# Patient Record
Sex: Male | Born: 1945 | Race: White | Hispanic: No | Marital: Married | Smoking: Former smoker
Health system: Southern US, Community
[De-identification: ages and names within clinical notes are randomized; demographics above are authoritative.]

## PROBLEM LIST (undated history)

## (undated) DIAGNOSIS — Z8719 Personal history of other diseases of the digestive system: Secondary | ICD-10-CM

## (undated) DIAGNOSIS — M1990 Unspecified osteoarthritis, unspecified site: Secondary | ICD-10-CM

## (undated) DIAGNOSIS — I10 Essential (primary) hypertension: Secondary | ICD-10-CM

## (undated) DIAGNOSIS — J449 Chronic obstructive pulmonary disease, unspecified: Secondary | ICD-10-CM

## (undated) DIAGNOSIS — E039 Hypothyroidism, unspecified: Secondary | ICD-10-CM

## (undated) DIAGNOSIS — J302 Other seasonal allergic rhinitis: Secondary | ICD-10-CM

## (undated) DIAGNOSIS — K219 Gastro-esophageal reflux disease without esophagitis: Secondary | ICD-10-CM

## (undated) DIAGNOSIS — F419 Anxiety disorder, unspecified: Secondary | ICD-10-CM

## (undated) DIAGNOSIS — E7800 Pure hypercholesterolemia, unspecified: Secondary | ICD-10-CM

## (undated) DIAGNOSIS — G8929 Other chronic pain: Secondary | ICD-10-CM

## (undated) DIAGNOSIS — R1013 Epigastric pain: Secondary | ICD-10-CM

## (undated) HISTORY — PX: TONSILLECTOMY: SUR1361

## (undated) HISTORY — DX: Other chronic pain: G89.29

## (undated) HISTORY — DX: Epigastric pain: R10.13

## (undated) HISTORY — PX: EYE SURGERY: SHX253

## (undated) HISTORY — PX: FRACTURE SURGERY: SHX138

## (undated) HISTORY — PX: HERNIA REPAIR: SHX51

## (undated) HISTORY — PX: KNEE SURGERY: SHX244

## (undated) HISTORY — PX: SHOULDER ARTHROSCOPY: SHX128

---

## 2013-10-21 ENCOUNTER — Other Ambulatory Visit: Payer: Self-pay | Admitting: Orthopaedic Surgery

## 2013-10-21 DIAGNOSIS — G8929 Other chronic pain: Secondary | ICD-10-CM

## 2013-10-31 ENCOUNTER — Ambulatory Visit
Admission: RE | Admit: 2013-10-31 | Discharge: 2013-10-31 | Disposition: A | Discharge status: Home / Self Care | Payer: Medicare Other | Source: Ambulatory Visit | Attending: Orthopaedic Surgery | Admitting: Orthopaedic Surgery

## 2013-10-31 DIAGNOSIS — G8929 Other chronic pain: Secondary | ICD-10-CM

## 2013-12-30 ENCOUNTER — Encounter (HOSPITAL_COMMUNITY): Payer: Self-pay | Admitting: Pharmacy Technician

## 2013-12-30 ENCOUNTER — Other Ambulatory Visit: Payer: Self-pay | Admitting: Neurosurgery

## 2013-12-31 NOTE — Pre-Procedure Instructions (Addendum)
Francisco Butler  12/31/2013   Your procedure is scheduled IW:[id number]  01/05/14 at 0730AM  Report to [facility] Short Stay Main Entrance "A" at 530 AM.  Call this number if you have problems the morning of surgery: [phone]   Remember:   Do not eat food or drink liquids after midnight Monday   Take these medicines the morning of surgery with A SIP OF WATER: all inhalers,Synthroid,Singular, Prilosec and Tramadol if needed for pain  Stop all NSAIDS (Naproxen,Aleve,Advil, Aspirin, Vitamins and Herbal medication as of today.  Do not wear jewelry, make-up or nail polish.  Do not wear lotions, powders, or perfumes. You may wear deodorant.  Do not shave 48 hours prior to surgery. Men may shave face and neck.  Do not bring valuables to the hospital.  [facility] is not responsible  for any belongings or valuables.               Contacts, dentures or bridgework may not be worn into surgery.  Leave suitcase in the car. After surgery it may be brought to your room.  For patients admitted to the hospital, discharge time is determined by your  treatment team.               Patients discharged the day of surgery will not be allowed to drive home.  Name and phone number of your driver: family  Special Instructions: Shower using CHG 2 nights before surgery and the night before surgery.  If you shower the day of surgery use CHG.  Use special wash - you have one bottle of CHG for all showers.  You should use approximately 1/3 of the bottle for each shower.   Please read over the following fact sheets that you were given: Pain Booklet, Coughing and Deep Breathing, MRSA Information and Surgical Site Infection Prevention

## 2014-01-01 ENCOUNTER — Encounter (HOSPITAL_COMMUNITY)
Admission: RE | Admit: 2014-01-01 | Discharge: 2014-01-01 | Disposition: A | Discharge status: Home / Self Care | Payer: Medicare Other | Source: Ambulatory Visit | Attending: Neurosurgery | Admitting: Neurosurgery

## 2014-01-01 ENCOUNTER — Ambulatory Visit (HOSPITAL_COMMUNITY)
Admission: RE | Admit: 2014-01-01 | Discharge: 2014-01-01 | Disposition: A | Discharge status: Home / Self Care | Payer: Medicare Other | Source: Ambulatory Visit | Attending: Anesthesiology | Admitting: Anesthesiology

## 2014-01-01 ENCOUNTER — Encounter (HOSPITAL_COMMUNITY): Payer: Self-pay

## 2014-01-01 DIAGNOSIS — J449 Chronic obstructive pulmonary disease, unspecified: Secondary | ICD-10-CM | POA: Insufficient documentation

## 2014-01-01 DIAGNOSIS — J4489 Other specified chronic obstructive pulmonary disease: Secondary | ICD-10-CM | POA: Insufficient documentation

## 2014-01-01 DIAGNOSIS — Z01810 Encounter for preprocedural cardiovascular examination: Secondary | ICD-10-CM | POA: Insufficient documentation

## 2014-01-01 DIAGNOSIS — I10 Essential (primary) hypertension: Secondary | ICD-10-CM | POA: Insufficient documentation

## 2014-01-01 DIAGNOSIS — Z01818 Encounter for other preprocedural examination: Secondary | ICD-10-CM | POA: Insufficient documentation

## 2014-01-01 HISTORY — DX: Essential (primary) hypertension: I10

## 2014-01-01 HISTORY — DX: Hypothyroidism, unspecified: E03.9

## 2014-01-01 HISTORY — DX: Gastro-esophageal reflux disease without esophagitis: K21.9

## 2014-01-01 HISTORY — DX: Anxiety disorder, unspecified: F41.9

## 2014-01-01 HISTORY — DX: Unspecified osteoarthritis, unspecified site: M19.90

## 2014-01-01 HISTORY — DX: Personal history of other diseases of the digestive system: Z87.19

## 2014-01-01 HISTORY — DX: Other seasonal allergic rhinitis: J30.2

## 2014-01-01 HISTORY — DX: Chronic obstructive pulmonary disease, unspecified: J44.9

## 2014-01-01 LAB — TYPE AND SCREEN
ABO/RH(D): A POS
ANTIBODY SCREEN: NEGATIVE

## 2014-01-01 LAB — CBC
HEMATOCRIT: 37.9 % — AB (ref 39.0–52.0)
Hemoglobin: 13.6 g/dL (ref 13.0–17.0)
MCH: 30.6 pg (ref 26.0–34.0)
MCHC: 35.9 g/dL (ref 30.0–36.0)
MCV: 85.4 fL (ref 78.0–100.0)
PLATELETS: 192 10*3/uL (ref 150–400)
RBC: 4.44 MIL/uL (ref 4.22–5.81)
RDW: 12.6 % (ref 11.5–15.5)
WBC: 7.6 10*3/uL (ref 4.0–10.5)

## 2014-01-01 LAB — BASIC METABOLIC PANEL
BUN: 16 mg/dL (ref 6–23)
CHLORIDE: 98 meq/L (ref 96–112)
CO2: 27 mEq/L (ref 19–32)
CREATININE: 1.1 mg/dL (ref 0.50–1.35)
Calcium: 9.2 mg/dL (ref 8.4–10.5)
GFR calc Af Amer: 78 mL/min — ABNORMAL LOW (ref >90)
GFR calc non Af Amer: 67 mL/min — ABNORMAL LOW (ref >90)
Glucose, Bld: 96 mg/dL (ref 70–99)
Potassium: 3.9 mEq/L (ref 3.7–5.3)
Sodium: 136 mEq/L — ABNORMAL LOW (ref 137–147)

## 2014-01-01 LAB — SURGICAL PCR SCREEN
MRSA, PCR: NEGATIVE
Staphylococcus aureus: NEGATIVE

## 2014-01-01 LAB — ABO/RH: ABO/RH(D): A POS

## 2014-01-04 ENCOUNTER — Other Ambulatory Visit (HOSPITAL_COMMUNITY): Payer: Medicare Other

## 2014-01-04 MED ORDER — CEFAZOLIN SODIUM-DEXTROSE 2-3 GM-% IV SOLR
2.0000 g | INTRAVENOUS | Status: AC
  Administered 2014-01-05: 2 g via INTRAVENOUS
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
## 2014-01-05 ENCOUNTER — Inpatient Hospital Stay (HOSPITAL_COMMUNITY): Payer: Medicare Other | Admitting: Certified Registered"

## 2014-01-05 ENCOUNTER — Inpatient Hospital Stay (HOSPITAL_COMMUNITY): Payer: Medicare Other

## 2014-01-05 ENCOUNTER — Encounter (HOSPITAL_COMMUNITY): Payer: Medicare Other | Admitting: Certified Registered"

## 2014-01-05 ENCOUNTER — Inpatient Hospital Stay (HOSPITAL_COMMUNITY)
Admission: RE | Admit: 2014-01-05 | Discharge: 2014-01-06 | DRG: 472 | Disposition: A | Discharge status: Home / Self Care | Payer: Medicare Other | Source: Ambulatory Visit | Attending: Neurosurgery | Admitting: Neurosurgery

## 2014-01-05 ENCOUNTER — Encounter (HOSPITAL_COMMUNITY)
Admission: RE | Disposition: A | Discharge status: Home / Self Care | Payer: Self-pay | Source: Ambulatory Visit | Attending: Neurosurgery

## 2014-01-05 DIAGNOSIS — Z87891 Personal history of nicotine dependence: Secondary | ICD-10-CM

## 2014-01-05 DIAGNOSIS — Z79899 Other long term (current) drug therapy: Secondary | ICD-10-CM

## 2014-01-05 DIAGNOSIS — M4722 Other spondylosis with radiculopathy, cervical region: Secondary | ICD-10-CM

## 2014-01-05 DIAGNOSIS — I10 Essential (primary) hypertension: Secondary | ICD-10-CM | POA: Diagnosis present

## 2014-01-05 DIAGNOSIS — J449 Chronic obstructive pulmonary disease, unspecified: Secondary | ICD-10-CM | POA: Diagnosis present

## 2014-01-05 DIAGNOSIS — M5020 Other cervical disc displacement, unspecified cervical region: Principal | ICD-10-CM | POA: Diagnosis present

## 2014-01-05 DIAGNOSIS — F411 Generalized anxiety disorder: Secondary | ICD-10-CM | POA: Diagnosis present

## 2014-01-05 DIAGNOSIS — E039 Hypothyroidism, unspecified: Secondary | ICD-10-CM | POA: Diagnosis present

## 2014-01-05 DIAGNOSIS — J4489 Other specified chronic obstructive pulmonary disease: Secondary | ICD-10-CM | POA: Diagnosis present

## 2014-01-05 DIAGNOSIS — M4712 Other spondylosis with myelopathy, cervical region: Secondary | ICD-10-CM | POA: Diagnosis present

## 2014-01-05 DIAGNOSIS — K219 Gastro-esophageal reflux disease without esophagitis: Secondary | ICD-10-CM | POA: Diagnosis present

## 2014-01-05 DIAGNOSIS — E785 Hyperlipidemia, unspecified: Secondary | ICD-10-CM | POA: Diagnosis present

## 2014-01-05 HISTORY — PX: ANTERIOR CERVICAL DECOMP/DISCECTOMY FUSION: SHX1161

## 2014-01-05 SURGERY — ANTERIOR CERVICAL DECOMPRESSION/DISCECTOMY FUSION 3 LEVELS
Anesthesia: General | Site: Neck

## 2014-01-05 MED ORDER — LIDOCAINE-EPINEPHRINE 1 %-1:100000 IJ SOLN
INTRAMUSCULAR | Status: DC | PRN
  Administered 2014-01-05: 3.5 mL

## 2014-01-05 MED ORDER — KCL IN DEXTROSE-NACL 20-5-0.45 MEQ/L-%-% IV SOLN
INTRAVENOUS | Status: DC
  Administered 2014-01-05: 11:00:00 via INTRAVENOUS
  Filled 2014-01-05 (×3): qty 1000

## 2014-01-05 MED ORDER — ALUM & MAG HYDROXIDE-SIMETH 200-200-20 MG/5ML PO SUSP: 30.0000 mL | Freq: Four times a day (QID) | ORAL | Status: DC | PRN

## 2014-01-05 MED ORDER — CYCLOBENZAPRINE HCL 10 MG PO TABS
ORAL_TABLET | ORAL | Status: AC
  Filled 2014-01-05: qty 1

## 2014-01-05 MED ORDER — LACTATED RINGERS IV SOLN
INTRAVENOUS | Status: DC | PRN
  Administered 2014-01-05 (×2): via INTRAVENOUS

## 2014-01-05 MED ORDER — PHENOL 1.4 % MT LIQD: 1.0000 | OROMUCOSAL | Status: DC | PRN

## 2014-01-05 MED ORDER — 0.9 % SODIUM CHLORIDE (POUR BTL) OPTIME
TOPICAL | Status: DC | PRN
  Administered 2014-01-05: 1000 mL

## 2014-01-05 MED ORDER — CEFAZOLIN SODIUM 1-5 GM-% IV SOLN
1.0000 g | Freq: Three times a day (TID) | INTRAVENOUS | Status: AC
  Administered 2014-01-05 (×2): 1 g via INTRAVENOUS
  Filled 2014-01-05 (×2): qty 50

## 2014-01-05 MED ORDER — ONDANSETRON HCL 4 MG/2ML IJ SOLN
4.0000 mg | INTRAMUSCULAR | Status: DC | PRN
  Administered 2014-01-05: 4 mg via INTRAVENOUS
  Filled 2014-01-05: qty 2

## 2014-01-05 MED ORDER — MONTELUKAST SODIUM 10 MG PO TABS
10.0000 mg | ORAL_TABLET | Freq: Every day | ORAL | Status: DC
  Administered 2014-01-05: 10 mg via ORAL
  Filled 2014-01-05 (×2): qty 1

## 2014-01-05 MED ORDER — ROCURONIUM BROMIDE 100 MG/10ML IV SOLN
INTRAVENOUS | Status: DC | PRN
  Administered 2014-01-05: 50 mg via INTRAVENOUS
  Administered 2014-01-05: 20 mg via INTRAVENOUS

## 2014-01-05 MED ORDER — SUCCINYLCHOLINE CHLORIDE 20 MG/ML IJ SOLN
INTRAMUSCULAR | Status: AC
  Filled 2014-01-05: qty 1

## 2014-01-05 MED ORDER — SODIUM CHLORIDE 0.9 % IJ SOLN
3.0000 mL | Freq: Two times a day (BID) | INTRAMUSCULAR | Status: DC
  Administered 2014-01-05 (×2): 3 mL via INTRAVENOUS

## 2014-01-05 MED ORDER — ROCURONIUM BROMIDE 50 MG/5ML IV SOLN
INTRAVENOUS | Status: AC
Start: 2014-01-05 — End: 2014-01-05
  Filled 2014-01-05: qty 1

## 2014-01-05 MED ORDER — EPHEDRINE SULFATE 50 MG/ML IJ SOLN
INTRAMUSCULAR | Status: AC
  Filled 2014-01-05: qty 1

## 2014-01-05 MED ORDER — BISACODYL 10 MG RE SUPP: 10.0000 mg | Freq: Every day | RECTAL | Status: DC | PRN

## 2014-01-05 MED ORDER — MORPHINE SULFATE 2 MG/ML IJ SOLN
INTRAMUSCULAR | Status: AC
  Filled 2014-01-05: qty 1

## 2014-01-05 MED ORDER — TIOTROPIUM BROMIDE MONOHYDRATE 18 MCG IN CAPS
18.0000 ug | ORAL_CAPSULE | Freq: Every day | RESPIRATORY_TRACT | Status: DC | PRN
  Filled 2014-01-05: qty 5

## 2014-01-05 MED ORDER — NEOSTIGMINE METHYLSULFATE 1 MG/ML IJ SOLN
INTRAMUSCULAR | Status: DC | PRN
  Administered 2014-01-05: 4 mg via INTRAVENOUS

## 2014-01-05 MED ORDER — MENTHOL 3 MG MT LOZG
1.0000 | LOZENGE | OROMUCOSAL | Status: DC | PRN
  Administered 2014-01-05: 3 mg via ORAL
  Filled 2014-01-05: qty 9

## 2014-01-05 MED ORDER — GLYCOPYRROLATE 0.2 MG/ML IJ SOLN
INTRAMUSCULAR | Status: DC | PRN
  Administered 2014-01-05: 0.6 mg via INTRAVENOUS

## 2014-01-05 MED ORDER — ROCURONIUM BROMIDE 50 MG/5ML IV SOLN
INTRAVENOUS | Status: AC
  Filled 2014-01-05: qty 1

## 2014-01-05 MED ORDER — HYDROCODONE-ACETAMINOPHEN 5-325 MG PO TABS: 1.0000 | ORAL_TABLET | ORAL | Status: DC | PRN

## 2014-01-05 MED ORDER — LIDOCAINE HCL (CARDIAC) 20 MG/ML IV SOLN
INTRAVENOUS | Status: AC
  Filled 2014-01-05: qty 5

## 2014-01-05 MED ORDER — ACETAMINOPHEN 325 MG PO TABS: 650.0000 mg | ORAL_TABLET | ORAL | Status: DC | PRN

## 2014-01-05 MED ORDER — SENNA 8.6 MG PO TABS
1.0000 | ORAL_TABLET | Freq: Two times a day (BID) | ORAL | Status: DC
  Administered 2014-01-05 (×2): 8.6 mg via ORAL
  Filled 2014-01-05 (×4): qty 1

## 2014-01-05 MED ORDER — KCL IN DEXTROSE-NACL 20-5-0.45 MEQ/L-%-% IV SOLN
INTRAVENOUS | Status: AC
  Filled 2014-01-05: qty 1000

## 2014-01-05 MED ORDER — PHENYLEPHRINE 40 MCG/ML (10ML) SYRINGE FOR IV PUSH (FOR BLOOD PRESSURE SUPPORT)
PREFILLED_SYRINGE | INTRAVENOUS | Status: AC
  Filled 2014-01-05: qty 10

## 2014-01-05 MED ORDER — FLEET ENEMA 7-19 GM/118ML RE ENEM
1.0000 | ENEMA | Freq: Once | RECTAL | Status: AC | PRN
  Filled 2014-01-05: qty 1

## 2014-01-05 MED ORDER — POLYETHYLENE GLYCOL 3350 17 G PO PACK
17.0000 g | PACK | Freq: Every day | ORAL | Status: DC | PRN
  Filled 2014-01-05: qty 1

## 2014-01-05 MED ORDER — TRAMADOL HCL 50 MG PO TABS: 50.0000 mg | ORAL_TABLET | Freq: Four times a day (QID) | ORAL | Status: DC | PRN

## 2014-01-05 MED ORDER — ACETAMINOPHEN 650 MG RE SUPP: 650.0000 mg | RECTAL | Status: DC | PRN

## 2014-01-05 MED ORDER — ZOLPIDEM TARTRATE 5 MG PO TABS: 5.0000 mg | ORAL_TABLET | Freq: Every evening | ORAL | Status: DC | PRN

## 2014-01-05 MED ORDER — PROPOFOL 10 MG/ML IV BOLUS
INTRAVENOUS | Status: DC | PRN
  Administered 2014-01-05: 20 mg via INTRAVENOUS
  Administered 2014-01-05: 160 mg via INTRAVENOUS

## 2014-01-05 MED ORDER — SODIUM CHLORIDE 0.9 % IJ SOLN
INTRAMUSCULAR | Status: AC
  Filled 2014-01-05: qty 10

## 2014-01-05 MED ORDER — OXYCODONE-ACETAMINOPHEN 5-325 MG PO TABS
ORAL_TABLET | ORAL | Status: AC
  Filled 2014-01-05: qty 2

## 2014-01-05 MED ORDER — PHENYLEPHRINE HCL 10 MG/ML IJ SOLN
10.0000 mg | INTRAVENOUS | Status: DC | PRN
  Administered 2014-01-05: 10 ug/min via INTRAVENOUS

## 2014-01-05 MED ORDER — MORPHINE SULFATE 2 MG/ML IJ SOLN
1.0000 mg | INTRAMUSCULAR | Status: DC | PRN
  Administered 2014-01-05: 2 mg via INTRAVENOUS

## 2014-01-05 MED ORDER — DOCUSATE SODIUM 100 MG PO CAPS
100.0000 mg | ORAL_CAPSULE | Freq: Two times a day (BID) | ORAL | Status: DC
  Administered 2014-01-05 (×2): 100 mg via ORAL
  Filled 2014-01-05 (×4): qty 1

## 2014-01-05 MED ORDER — ALBUTEROL SULFATE (2.5 MG/3ML) 0.083% IN NEBU: 3.0000 mL | INHALATION_SOLUTION | Freq: Four times a day (QID) | RESPIRATORY_TRACT | Status: DC | PRN

## 2014-01-05 MED ORDER — GABAPENTIN 300 MG PO CAPS
300.0000 mg | ORAL_CAPSULE | Freq: Every day | ORAL | Status: DC
  Administered 2014-01-05: 300 mg via ORAL
  Filled 2014-01-05 (×2): qty 1

## 2014-01-05 MED ORDER — FENTANYL CITRATE 0.05 MG/ML IJ SOLN
INTRAMUSCULAR | Status: DC | PRN
  Administered 2014-01-05: 100 ug via INTRAVENOUS
  Administered 2014-01-05: 50 ug via INTRAVENOUS

## 2014-01-05 MED ORDER — ROPINIROLE HCL 1 MG PO TABS
1.0000 mg | ORAL_TABLET | Freq: Every evening | ORAL | Status: DC | PRN
  Filled 2014-01-05: qty 1

## 2014-01-05 MED ORDER — DEXAMETHASONE SODIUM PHOSPHATE 10 MG/ML IJ SOLN
INTRAMUSCULAR | Status: DC | PRN
  Administered 2014-01-05: 10 mg via INTRAVENOUS

## 2014-01-05 MED ORDER — MIDAZOLAM HCL 2 MG/2ML IJ SOLN
INTRAMUSCULAR | Status: AC
  Filled 2014-01-05: qty 2

## 2014-01-05 MED ORDER — MIDAZOLAM HCL 5 MG/5ML IJ SOLN
INTRAMUSCULAR | Status: DC | PRN
  Administered 2014-01-05: 2 mg via INTRAVENOUS

## 2014-01-05 MED ORDER — LEVOTHYROXINE SODIUM 150 MCG PO TABS
150.0000 ug | ORAL_TABLET | Freq: Every day | ORAL | Status: DC
  Administered 2014-01-06: 150 ug via ORAL
  Filled 2014-01-05 (×2): qty 1

## 2014-01-05 MED ORDER — THROMBIN 20000 UNITS EX SOLR
CUTANEOUS | Status: DC | PRN
  Administered 2014-01-05: 08:00:00 via TOPICAL

## 2014-01-05 MED ORDER — HYDROCHLOROTHIAZIDE 12.5 MG PO CAPS
12.5000 mg | ORAL_CAPSULE | Freq: Every day | ORAL | Status: DC
  Administered 2014-01-05: 12.5 mg via ORAL
  Filled 2014-01-05 (×2): qty 1

## 2014-01-05 MED ORDER — SIMVASTATIN 20 MG PO TABS
20.0000 mg | ORAL_TABLET | Freq: Every day | ORAL | Status: DC
  Administered 2014-01-05: 20 mg via ORAL
  Filled 2014-01-05 (×2): qty 1

## 2014-01-05 MED ORDER — CYCLOBENZAPRINE HCL 10 MG PO TABS
10.0000 mg | ORAL_TABLET | Freq: Every day | ORAL | Status: DC | PRN
  Administered 2014-01-05: 10 mg via ORAL

## 2014-01-05 MED ORDER — PANTOPRAZOLE SODIUM 40 MG PO TBEC
40.0000 mg | DELAYED_RELEASE_TABLET | Freq: Every day | ORAL | Status: DC
  Administered 2014-01-05: 40 mg via ORAL
  Filled 2014-01-05: qty 1

## 2014-01-05 MED ORDER — LIDOCAINE HCL (CARDIAC) 20 MG/ML IV SOLN
INTRAVENOUS | Status: DC | PRN
  Administered 2014-01-05: 80 mg via INTRAVENOUS
  Administered 2014-01-05: 20 mg via INTRAVENOUS

## 2014-01-05 MED ORDER — PHENYLEPHRINE HCL 10 MG/ML IJ SOLN
INTRAMUSCULAR | Status: DC | PRN
  Administered 2014-01-05 (×2): 40 ug via INTRAVENOUS
  Administered 2014-01-05: 80 ug via INTRAVENOUS
  Administered 2014-01-05: 40 ug via INTRAVENOUS

## 2014-01-05 MED ORDER — LISINOPRIL-HYDROCHLOROTHIAZIDE 20-12.5 MG PO TABS: 1.0000 | ORAL_TABLET | Freq: Every day | ORAL | Status: DC

## 2014-01-05 MED ORDER — DIAZEPAM 5 MG PO TABS
5.0000 mg | ORAL_TABLET | Freq: Four times a day (QID) | ORAL | Status: DC | PRN
  Administered 2014-01-05: 5 mg via ORAL
  Filled 2014-01-05: qty 1

## 2014-01-05 MED ORDER — LISINOPRIL 20 MG PO TABS
20.0000 mg | ORAL_TABLET | Freq: Every day | ORAL | Status: DC
  Administered 2014-01-05: 20 mg via ORAL
  Filled 2014-01-05 (×2): qty 1

## 2014-01-05 MED ORDER — FENTANYL CITRATE 0.05 MG/ML IJ SOLN
INTRAMUSCULAR | Status: AC
  Filled 2014-01-05: qty 5

## 2014-01-05 MED ORDER — SODIUM CHLORIDE 0.9 % IJ SOLN: 3.0000 mL | INTRAMUSCULAR | Status: DC | PRN

## 2014-01-05 MED ORDER — DEXAMETHASONE SODIUM PHOSPHATE 4 MG/ML IJ SOLN
INTRAMUSCULAR | Status: AC
  Filled 2014-01-05: qty 3

## 2014-01-05 MED ORDER — PROPOFOL 10 MG/ML IV BOLUS
INTRAVENOUS | Status: AC
  Filled 2014-01-05: qty 20

## 2014-01-05 MED ORDER — BUPIVACAINE HCL (PF) 0.5 % IJ SOLN
INTRAMUSCULAR | Status: DC | PRN
  Administered 2014-01-05: 3.5 mL

## 2014-01-05 MED ORDER — PHENYLEPHRINE HCL 10 MG/ML IJ SOLN
INTRAMUSCULAR | Status: AC
  Filled 2014-01-05: qty 1

## 2014-01-05 MED ORDER — ONDANSETRON HCL 4 MG/2ML IJ SOLN
INTRAMUSCULAR | Status: DC | PRN
  Administered 2014-01-05: 4 mg via INTRAVENOUS

## 2014-01-05 MED ORDER — OXYCODONE-ACETAMINOPHEN 5-325 MG PO TABS
1.0000 | ORAL_TABLET | ORAL | Status: DC | PRN
  Administered 2014-01-05 (×2): 2 via ORAL
  Filled 2014-01-05: qty 2

## 2014-01-05 MED ORDER — PANTOPRAZOLE SODIUM 40 MG IV SOLR
40.0000 mg | Freq: Every day | INTRAVENOUS | Status: DC
  Administered 2014-01-05: 40 mg via INTRAVENOUS
  Filled 2014-01-05 (×2): qty 40

## 2014-01-05 SURGICAL SUPPLY — 79 items
ALLOGRAFT LORDOTIC 8X11X14 (Bone Implant) ×3 IMPLANT
ALLOGRAFT TRIAD LORDOTIC CC (Bone Implant) ×6 IMPLANT
BAG DECANTER FOR FLEXI CONT (MISCELLANEOUS) IMPLANT
BANDAGE GAUZE ELAST BULKY 4 IN (GAUZE/BANDAGES/DRESSINGS) ×6 IMPLANT
BENZOIN TINCTURE PRP APPL 2/3 (GAUZE/BANDAGES/DRESSINGS) IMPLANT
BIT DRILL NEURO 2X3.1 SFT TUCH (MISCELLANEOUS) ×1 IMPLANT
BIT DRILL POWER (BIT) ×1 IMPLANT
BLADE ULTRA TIP 2M (BLADE) IMPLANT
BUR BARREL STRAIGHT FLUTE 4.0 (BURR) ×3 IMPLANT
CANISTER SUCT 3000ML (MISCELLANEOUS) ×3 IMPLANT
CLOSURE WOUND 1/2 X4 (GAUZE/BANDAGES/DRESSINGS) ×1
CONT SPEC 4OZ CLIKSEAL STRL BL (MISCELLANEOUS) ×3 IMPLANT
CORDS BIPOLAR (ELECTRODE) ×3 IMPLANT
COVER MAYO STAND STRL (DRAPES) ×3 IMPLANT
DERMABOND ADHESIVE PROPEN (GAUZE/BANDAGES/DRESSINGS) ×2
DERMABOND ADVANCED (GAUZE/BANDAGES/DRESSINGS)
DERMABOND ADVANCED .7 DNX12 (GAUZE/BANDAGES/DRESSINGS) IMPLANT
DERMABOND ADVANCED .7 DNX6 (GAUZE/BANDAGES/DRESSINGS) ×1 IMPLANT
DRAPE LAPAROTOMY 100X72 PEDS (DRAPES) ×3 IMPLANT
DRAPE MICROSCOPE LEICA (MISCELLANEOUS) ×3 IMPLANT
DRAPE POUCH INSTRU U-SHP 10X18 (DRAPES) ×3 IMPLANT
DRAPE PROXIMA HALF (DRAPES) ×3 IMPLANT
DRESSING TELFA 8X3 (GAUZE/BANDAGES/DRESSINGS) IMPLANT
DRILL BIT POWER (BIT) ×2
DRILL NEURO 2X3.1 SOFT TOUCH (MISCELLANEOUS) ×3
DURAPREP 6ML APPLICATOR 50/CS (WOUND CARE) ×3 IMPLANT
ELECT COATED BLADE 2.86 ST (ELECTRODE) ×3 IMPLANT
ELECT REM PT RETURN 9FT ADLT (ELECTROSURGICAL) ×3
ELECTRODE REM PT RTRN 9FT ADLT (ELECTROSURGICAL) ×1 IMPLANT
GAUZE SPONGE 4X4 16PLY XRAY LF (GAUZE/BANDAGES/DRESSINGS) IMPLANT
GLOVE BIO SURGEON STRL SZ8 (GLOVE) ×6 IMPLANT
GLOVE BIOGEL PI IND STRL 7.5 (GLOVE) ×3 IMPLANT
GLOVE BIOGEL PI IND STRL 8 (GLOVE) ×1 IMPLANT
GLOVE BIOGEL PI IND STRL 8.5 (GLOVE) ×1 IMPLANT
GLOVE BIOGEL PI INDICATOR 7.5 (GLOVE) ×6
GLOVE BIOGEL PI INDICATOR 8 (GLOVE) ×2
GLOVE BIOGEL PI INDICATOR 8.5 (GLOVE) ×2
GLOVE ECLIPSE 7.0 STRL STRAW (GLOVE) ×3 IMPLANT
GLOVE ECLIPSE 8.0 STRL XLNG CF (GLOVE) ×3 IMPLANT
GLOVE EXAM NITRILE LRG STRL (GLOVE) IMPLANT
GLOVE EXAM NITRILE MD LF STRL (GLOVE) IMPLANT
GLOVE EXAM NITRILE XL STR (GLOVE) IMPLANT
GLOVE EXAM NITRILE XS STR PU (GLOVE) IMPLANT
GLOVE SURG SS PI 7.0 STRL IVOR (GLOVE) ×9 IMPLANT
GOWN BRE IMP SLV AUR LG STRL (GOWN DISPOSABLE) IMPLANT
GOWN BRE IMP SLV AUR XL STRL (GOWN DISPOSABLE) IMPLANT
GOWN STRL REIN 2XL LVL4 (GOWN DISPOSABLE) IMPLANT
GOWN STRL REUS W/ TWL LRG LVL3 (GOWN DISPOSABLE) ×3 IMPLANT
GOWN STRL REUS W/ TWL XL LVL3 (GOWN DISPOSABLE) IMPLANT
GOWN STRL REUS W/TWL 2XL LVL3 (GOWN DISPOSABLE) ×3 IMPLANT
GOWN STRL REUS W/TWL LRG LVL3 (GOWN DISPOSABLE) ×6
GOWN STRL REUS W/TWL XL LVL3 (GOWN DISPOSABLE)
HEAD HALTER (SOFTGOODS) ×3 IMPLANT
KIT BASIN OR (CUSTOM PROCEDURE TRAY) ×3 IMPLANT
KIT ROOM TURNOVER OR (KITS) ×3 IMPLANT
NEEDLE HYPO 25X1 1.5 SAFETY (NEEDLE) ×3 IMPLANT
NEEDLE SPNL 18GX3.5 QUINCKE PK (NEEDLE) IMPLANT
NEEDLE SPNL 22GX3.5 QUINCKE BK (NEEDLE) ×6 IMPLANT
NS IRRIG 1000ML POUR BTL (IV SOLUTION) ×3 IMPLANT
PACK LAMINECTOMY NEURO (CUSTOM PROCEDURE TRAY) ×3 IMPLANT
PAD ARMBOARD 7.5X6 YLW CONV (MISCELLANEOUS) ×9 IMPLANT
PIN DISTRACTION 14MM (PIN) ×6 IMPLANT
PLATE ARCHON 3-LEVEL 56MM (Plate) ×3 IMPLANT
RUBBERBAND STERILE (MISCELLANEOUS) ×6 IMPLANT
SCREW ARCHON SELFTAP 4.0X13 (Screw) ×21 IMPLANT
SCREW ARCHON ST VAR 4.5X13MM (Screw) ×3 IMPLANT
SPONGE GAUZE 4X4 12PLY (GAUZE/BANDAGES/DRESSINGS) IMPLANT
SPONGE INTESTINAL PEANUT (DISPOSABLE) ×3 IMPLANT
SPONGE SURGIFOAM ABS GEL 100 (HEMOSTASIS) ×3 IMPLANT
STAPLER SKIN PROX WIDE 3.9 (STAPLE) IMPLANT
STRIP CLOSURE SKIN 1/2X4 (GAUZE/BANDAGES/DRESSINGS) ×2 IMPLANT
SUT VIC AB 3-0 SH 8-18 (SUTURE) ×6 IMPLANT
SYR 20ML ECCENTRIC (SYRINGE) ×3 IMPLANT
SYR 5ML LL (SYRINGE) IMPLANT
TOWEL OR 17X24 6PK STRL BLUE (TOWEL DISPOSABLE) ×3 IMPLANT
TOWEL OR 17X26 10 PK STRL BLUE (TOWEL DISPOSABLE) ×3 IMPLANT
TRAP SPECIMEN MUCOUS 40CC (MISCELLANEOUS) ×3 IMPLANT
TRAY FOLEY CATH 14FRSI W/METER (CATHETERS) IMPLANT
WATER STERILE IRR 1000ML POUR (IV SOLUTION) ×3 IMPLANT

## 2014-01-05 NOTE — Brief Op Note (Signed)
01/05/2014  10:38 AM  PATIENT:  Francisco Butler  68 y.o. 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 C 56, C 67, C 7 T 1  PROCEDURE:  Procedure(s) with comments: Cervical five-six, Cervical six-seven, Cervical seven-thoracic one anterior cervical decompression with fusion interbody prosthesis plating and bonegraft (N/A) - Cervical five-six, Cervical six-seven, Cervical seven-thoracic one anterior cervical decompression with fusion interbody prosthesis plating and bonegraft  SURGEON:  Surgeon(s) and Role:    * Erline Levine, MD - Primary    * Consuella Lose, MD - Assisting  PHYSICIAN ASSISTANT:   ASSISTANTS: Poteat, RN   ANESTHESIA:   general  EBL:  Total I/O In: 1000 [I.V.:1000] Out: -   BLOOD ADMINISTERED:none  DRAINS: none   LOCAL MEDICATIONS USED:  LIDOCAINE   SPECIMEN:  No Specimen  DISPOSITION OF SPECIMEN:  N/A  COUNTS:  YES  TOURNIQUET:  * No tourniquets in log *   DICTATION: Patient was brought to operating room and following the smooth and uncomplicated induction of general endotracheal anesthesia his head was placed on a horseshoe head holder he was placed in 5 pounds of Holter traction and his anterior neck was prepped and draped in usual sterile fashion. An incision was made on the left side of midline after infiltrating the skin and subcutaneous tissues with local lidocaine. The platysmal layer was incised and subplatysmal dissection was performed exposing the anterior border sternocleidomastoid muscle. Using blunt dissection the carotid sheath was kept lateral and trachea and esophagus kept medial exposing the anterior cervical spine. A bent spinal needle was placed it was felt to be the C 56 and C 45 levels and this was confirmed on intraoperative x-ray.  Longus coli muscles were taken down from the anterior cervical spine using electrocautery and key elevator and self-retaining retractor was placed. The interspace at C 7 T 1 was incised and a thorough discectomy was performed. Distraction pins were placed. Uncinate spurs and central spondylitic ridges were drilled down with a high-speed drill. The spinal cord dura and both C8 nerve roots were widely decompressed. There was a considerable amount of herniated disc material compressing the right C 8 nerve root and this was decompressed.  Hemostasis was assured. After trial sizing an 8 mm lordotic machined allograft bone wedge was selected and packed with autograft. The graft was tamped into position and countersunk appropriately. The retractor was moved and the interspace at C 67 was incised and a thorough discectomy was performed. Distraction pins were placed. Uncinate spurs and central spondylitic ridges were drilled down with a high-speed drill. The spinal cord dura and both C 7 nerve roots were widely decompressed. There was a considerable amount of spondylotic disc material and spurs which were removed. Hemostasis was assured. After trial sizing a 6 mm lordotic machined allograft bone wedge was selected and packed with autograft. The graft was tamped into position and countersunk appropriately.The interspace at C 56 was incised and a thorough discectomy was performed. Distraction pins were placed. Uncinate spurs and central spondylitic ridges were drilled down with a high-speed drill. The spinal cord dura and both C6 nerve roots were widely decompressed. A large osteophyte was removed on the right with decompression of the right C6 nerve root. Hemostasis was assured. After trial sizing a 6 mm lordotic machined allograft bone wedge was selected and packed with autograft.  The graft was tamped into position and countersunk appropriately.  Distraction weight was removed. A 56 mm Nuvasive anterior cervical plate was  affixed to the cervical spine with 13 mm variable-angle screws 2 at C5, 2 at C6, 2 at C7,  and 2 at T1. All screws were well-positioned and locking mechanisms were engaged. Soft tissues were inspected and found to be in good repair. The wound was irrigated. A final x-ray was obtained with good visualization at C5 with the interbody graft well visualized. The platysma layer was closed with 3-0 Vicryl stitches and the skin was reapproximated with 3-0 Vicryl subcuticular stitches. The wound was dressed with Dermabond. Counts were correct at the end of the case. Patient was extubated and taken to recovery in stable and satisfactory condition.    PLAN OF CARE: Admit for overnight observation  PATIENT DISPOSITION:  PACU - hemodynamically stable.   Delay start of Pharmacological VTE agent (>24hrs) due to surgical blood loss or risk of bleeding: yes

## 2014-01-05 NOTE — Plan of Care (Signed)
Problem: Consults Goal: Diagnosis - Spinal Surgery Outcome: Completed/Met Date Met:  01/05/14 Cervical Spine Fusion     

## 2014-01-05 NOTE — Anesthesia Preprocedure Evaluation (Addendum)
Anesthesia Evaluation  Patient identified by MRN, date of birth, ID band Patient awake    Reviewed: Allergy & Precautions, H&P , NPO status , Patient's Chart, lab work & pertinent test results  Airway Mallampati: II TM Distance: >3 FB Neck ROM: Full    Dental  (+) Edentulous Upper, Edentulous Lower and Dental Advisory Given   Pulmonary COPD COPD inhaler, former smoker,  breath sounds clear to auscultation        Cardiovascular hypertension, Pt. on medications Rhythm:Regular     Neuro/Psych Anxiety    GI/Hepatic Neg liver ROS, hiatal hernia, GERD-  Medicated and Controlled,  Endo/Other  Hypothyroidism   Renal/GU negative Renal ROS     Musculoskeletal   Abdominal (+)  Abdomen: soft. Bowel sounds: normal.  Peds  Hematology   Anesthesia Other Findings   Reproductive/Obstetrics                        Anesthesia Physical Anesthesia Plan  ASA: III  Anesthesia Plan: General   Post-op Pain Management:    Induction: Intravenous  Airway Management Planned: Oral ETT  Additional Equipment:   Intra-op Plan:   Post-operative Plan: Extubation in OR  Informed Consent: I have reviewed the patients History and Physical, chart, labs and discussed the procedure including the risks, benefits and alternatives for the proposed anesthesia with the patient or authorized representative who has indicated his/her understanding and acceptance.   Dental advisory given  Plan Discussed with: CRNA, Anesthesiologist and Surgeon  Anesthesia Plan Comments:         Anesthesia Quick Evaluation

## 2014-01-05 NOTE — Progress Notes (Signed)
Utilization review completed.  

## 2014-01-05 NOTE — Interval H&P Note (Signed)
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 After consideration of risks, benefits and other options for treatment, the patient has consented to  Procedure(s) with comments: ANTERIOR CERVICAL DECOMPRESSION/DISCECTOMY FUSION 3 LEVELS (N/A) - C56 C67 C7T1 anterior cervical decompression with fusion interbody prosthesis plating and bonegraft as a surgical intervention .  The patient's history has been reviewed, patient examined, no change in status, stable for surgery.  I have reviewed the patient's chart and labs.  Questions were answered to the patient's satisfaction.     Burlin Mcnair D

## 2014-01-05 NOTE — Transfer of Care (Signed)
Immediate Anesthesia Transfer of Care Note  Patient: Francisco Butler  Procedure(s) Performed: Procedure(s) with comments: Cervical five-six, Cervical six-seven, Cervical seven-thoracic one anterior cervical decompression with fusion interbody prosthesis plating and bonegraft (N/A) - Cervical five-six, Cervical six-seven, Cervical seven-thoracic one anterior cervical decompression with fusion interbody prosthesis plating and bonegraft  Patient Location: PACU  Anesthesia Type:General  Level of Consciousness: awake, alert  and sedated  Airway & Oxygen Therapy: Patient connected to face mask oxygen  Post-op Assessment: Report given to PACU RN  Post vital signs: stable  Complications: No apparent anesthesia complications

## 2014-01-05 NOTE — Anesthesia Procedure Notes (Signed)
Procedure Name: Intubation Date/Time: 01/05/2014 7:34 AM Performed by: Maeola Harman Pre-anesthesia Checklist: Patient identified, Emergency Drugs available, Suction available, Patient being monitored and Timeout performed Patient Re-evaluated:Patient Re-evaluated prior to inductionOxygen Delivery Method: Circle system utilized Preoxygenation: Pre-oxygenation with 100% oxygen Intubation Type: IV induction Ventilation: Mask ventilation without difficulty and Oral airway inserted - appropriate to patient size Laryngoscope Size: Mac and 3 Grade View: Grade I Tube type: Oral Tube size: 7.5 mm Number of attempts: 1 Airway Equipment and Method: Stylet Placement Confirmation: ETT inserted through vocal cords under direct vision,  positive ETCO2 and breath sounds checked- equal and bilateral Secured at: 22 cm Tube secured with: Tape Dental Injury: Teeth and Oropharynx as per pre-operative assessment  Comments: Easy atraumatic induction and intubation with MAC 3 blade.  Dr. Oletta Lamas verified placement of ETT.  Waldron Session, CRNA

## 2014-01-05 NOTE — Progress Notes (Signed)
Awake, alert, conversant.  Full strength both biceps, tricpes, still slight weakness right hand intrinsics.  Significantly improved from preop.  Doing well.

## 2014-01-05 NOTE — Anesthesia Postprocedure Evaluation (Signed)
  Anesthesia Post-op Note  Patient: Francisco Butler  Procedure(s) Performed: Procedure(s) with comments: Cervical five-six, Cervical six-seven, Cervical seven-thoracic one anterior cervical decompression with fusion interbody prosthesis plating and bonegraft (N/A) - Cervical five-six, Cervical six-seven, Cervical seven-thoracic one anterior cervical decompression with fusion interbody prosthesis plating and bonegraft  Patient Location: PACU  Anesthesia Type:General  Level of Consciousness: awake  Airway and Oxygen Therapy: Patient Spontanous Breathing  Post-op Pain: mild  Post-op Assessment: Post-op Vital signs reviewed  Post-op Vital Signs: Reviewed  Complications: No apparent anesthesia complications

## 2014-01-05 NOTE — Op Note (Signed)
01/05/2014  10:38 AM  PATIENT:  Francisco Butler  68 y.o. 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 C 56, C 67, C 7 T 1  PROCEDURE:  Procedure(s) with comments: Cervical five-six, Cervical six-seven, Cervical seven-thoracic one anterior cervical decompression with fusion interbody prosthesis plating and bonegraft (N/A) - Cervical five-six, Cervical six-seven, Cervical seven-thoracic one anterior cervical decompression with fusion interbody prosthesis plating and bonegraft  SURGEON:  Surgeon(s) and Role:    * Erline Levine, MD - Primary    * Consuella Lose, MD - Assisting  PHYSICIAN ASSISTANT:   ASSISTANTS: Poteat, RN   ANESTHESIA:   general  EBL:  Total I/O In: 1000 [I.V.:1000] Out: -   BLOOD ADMINISTERED:none  DRAINS: none   LOCAL MEDICATIONS USED:  LIDOCAINE   SPECIMEN:  No Specimen  DISPOSITION OF SPECIMEN:  N/A  COUNTS:  YES  TOURNIQUET:  * No tourniquets in log *   DICTATION: Patient was brought to operating room and following the smooth and uncomplicated induction of general endotracheal anesthesia his head was placed on a horseshoe head holder he was placed in 5 pounds of Holter traction and his anterior neck was prepped and draped in usual sterile fashion. An incision was made on the left side of midline after infiltrating the skin and subcutaneous tissues with local lidocaine. The platysmal layer was incised and subplatysmal dissection was performed exposing the anterior border sternocleidomastoid muscle. Using blunt dissection the carotid sheath was kept lateral and trachea and esophagus kept medial exposing the anterior cervical spine. A bent spinal needle was placed it was felt to be the C 56 and C 45 levels and this was confirmed on intraoperative x-ray.  Longus coli muscles were taken down from the anterior cervical spine using electrocautery and key elevator and self-retaining retractor was placed. The interspace at C 7 T 1 was incised and a thorough discectomy was performed. Distraction pins were placed. Uncinate spurs and central spondylitic ridges were drilled down with a high-speed drill. The spinal cord dura and both C8 nerve roots were widely decompressed. There was a considerable amount of herniated disc material compressing the right C 8 nerve root and this was decompressed.  Hemostasis was assured. After trial sizing an 8 mm lordotic machined allograft bone wedge was selected and packed with autograft. The graft was tamped into position and countersunk appropriately. The retractor was moved and the interspace at C 67 was incised and a thorough discectomy was performed. Distraction pins were placed. Uncinate spurs and central spondylitic ridges were drilled down with a high-speed drill. The spinal cord dura and both C 7 nerve roots were widely decompressed. There was a considerable amount of spondylotic disc material and spurs which were removed. Hemostasis was assured. After trial sizing a 6 mm lordotic machined allograft bone wedge was selected and packed with autograft. The graft was tamped into position and countersunk appropriately.The interspace at C 56 was incised and a thorough discectomy was performed. Distraction pins were placed. Uncinate spurs and central spondylitic ridges were drilled down with a high-speed drill. The spinal cord dura and both C6 nerve roots were widely decompressed. A large osteophyte was removed on the right with decompression of the right C6 nerve root. Hemostasis was assured. After trial sizing a 6 mm lordotic machined allograft bone wedge was selected and packed with autograft.  The graft was tamped into position and countersunk appropriately.  Distraction weight was removed. A 56 mm Nuvasive anterior cervical plate was  affixed to the cervical spine with 13 mm variable-angle screws 2 at C5, 2 at C6, 2 at C7,  and 2 at T1. All screws were well-positioned and locking mechanisms were engaged. Soft tissues were inspected and found to be in good repair. The wound was irrigated. A final x-ray was obtained with good visualization at C5 with the interbody graft well visualized. The platysma layer was closed with 3-0 Vicryl stitches and the skin was reapproximated with 3-0 Vicryl subcuticular stitches. The wound was dressed with Dermabond. Counts were correct at the end of the case. Patient was extubated and taken to recovery in stable and satisfactory condition.    PLAN OF CARE: Admit for overnight observation  PATIENT DISPOSITION:  PACU - hemodynamically stable.   Delay start of Pharmacological VTE agent (>24hrs) due to surgical blood loss or risk of bleeding: yes  

## 2014-01-06 MED ORDER — OXYCODONE-ACETAMINOPHEN 5-325 MG PO TABS: 1.0000 | ORAL_TABLET | ORAL | Status: DC | PRN

## 2014-01-06 MED ORDER — DIAZEPAM 5 MG PO TABS: 5.0000 mg | ORAL_TABLET | Freq: Four times a day (QID) | ORAL | Status: DC | PRN

## 2014-01-06 NOTE — Progress Notes (Signed)
Pt. Alert and oriented,follows simple instructions, denies pain. Incision area without swelling, redness or S/S of infection. Voiding adequate clear yellow urine. Moving all extremities well and vitals stable and documented. Anterior Cervical Fusion surgery notes instructions given to patient and family member for home safety and precautions. Pt. and family stated understanding of instructions given. 

## 2014-01-06 NOTE — Discharge Instructions (Signed)
Wound Care Leave incision open to air. You may shower. Do not scrub directly on incision.  Do not put any creams, lotions, or ointments on incision. Activity Walk each and every day, increasing distance each day. No lifting greater than 5 lbs.  Avoid excessive neck motion. No driving for 2 weeks; may ride as a passenger locally. Wear neck brace at all times except when showering.  Diet Resume your normal diet.  Return to Work Will be discussed at you follow up appointment. Call Your Doctor If Any of These Occur Redness, drainage, or swelling at the wound.  Temperature greater than 101 degrees. Severe pain not relieved by pain medication. Increased difficulty swallowing. Incision starts to come apart. Follow Up Appt Call today for appointment in 3-4 weeks ([phone]) or for problems.  If you have any hardware placed in your spine, you will need an x-ray before your appointment. 

## 2014-01-06 NOTE — Discharge Summary (Signed)
Physician Discharge Summary  Patient ID: Francisco Butler MRN: [id number] DOB/AGE: May 25, 1946 69 y.o.  Admit date: 01/05/2014 Discharge date: 01/06/2014  Admission Diagnoses:Herniated cervical disc, spondylosis, stenosis, radiculopathy C 56, C 67, C7T1  Discharge Diagnoses: Herniated cervical disc, spondylosis, stenosis, radiculopathy C 56, C 67, C7T1 Active Problems:   Cervical spondylosis with myelopathy and radiculopathy   Displacement of cervical intervertebral disc without myelopathy   Discharged Condition: good  Hospital Course: Uncomplicated decompression and fusion C 56, C 67, C7T1  Consults: None  Significant Diagnostic Studies: None  Treatments: surgery: decompression and fusion C 56, C 67, C7T1   Discharge Exam: Blood pressure 104/70, pulse 92, temperature 98.4 F (36.9 C), temperature source Oral, resp. rate 18, SpO2 92.00%. Neurologic: Alert and oriented X 3, normal strength and tone. Normal symmetric reflexes. Normal coordination and gait Wound: CDI  Disposition: Home     Medication List    STOP taking these medications       nabumetone 500 MG tablet  Commonly known as:  RELAFEN     naproxen sodium 220 MG tablet  Commonly known as:  ANAPROX      TAKE these medications       albuterol 108 (90 BASE) MCG/ACT inhaler  Commonly known as:  PROVENTIL HFA;VENTOLIN HFA  Inhale 2 puffs into the lungs every 6 (six) hours as needed for wheezing or shortness of breath.     cyclobenzaprine 10 MG tablet  Commonly known as:  FLEXERIL  Take 10 mg by mouth daily as needed for muscle spasms.     diazepam 5 MG tablet  Commonly known as:  VALIUM  Take 1 tablet (5 mg total) by mouth every 6 (six) hours as needed for muscle spasms.     gabapentin 300 MG capsule  Commonly known as:  NEURONTIN  Take 300 mg by mouth at bedtime.     levothyroxine 150 MCG tablet  Commonly known as:  SYNTHROID, LEVOTHROID  Take 150 mcg by mouth daily before breakfast.     lisinopril-hydrochlorothiazide 20-12.5 MG per tablet  Commonly known as:  PRINZIDE,ZESTORETIC  Take 1 tablet by mouth daily.     montelukast 10 MG tablet  Commonly known as:  SINGULAIR  Take 10 mg by mouth daily.     omeprazole 20 MG capsule  Commonly known as:  PRILOSEC  Take 20 mg by mouth daily.     oxyCODONE-acetaminophen 5-325 MG per tablet  Commonly known as:  PERCOCET/ROXICET  Take 1-2 tablets by mouth every 4 (four) hours as needed for moderate pain.     rOPINIRole 1 MG tablet  Commonly known as:  REQUIP  Take 1 mg by mouth at bedtime as needed (restless legs).     simvastatin 20 MG tablet  Commonly known as:  ZOCOR  Take 20 mg by mouth daily.     tiotropium 18 MCG inhalation capsule  Commonly known as:  SPIRIVA  Place 18 mcg into inhaler and inhale daily as needed (allergies/congestion).     traMADol 50 MG tablet  Commonly known as:  ULTRAM  Take 50 mg by mouth every 6 (six) hours as needed for moderate pain.         Signed: Peggyann Shoals, MD 01/06/2014, 8:19 AM

## 2014-01-06 NOTE — Progress Notes (Signed)
Subjective: Patient reports doing well  Objective: Vital signs in last 24 hours: Temp:  [96.8 F (36 C)-99.2 F (37.3 C)] 98.4 F (36.9 C) (02/04 0801) Pulse Rate:  [72-121] 92 (02/04 0801) Resp:  [15-22] 18 (02/04 0801) BP: (89-121)/(52-79) 104/70 mmHg (02/04 0801) SpO2:  [92 %-100 %] 92 % (02/04 0801)  Intake/Output from previous day: 02/03 0701 - 02/04 0700 In: 2760 [P.O.:960; I.V.:1800] Out: -  Intake/Output this shift:    Physical Exam: Full strength both upper extremities.  Dressing CDI.  Minimal pain, no numbness.  Lab Results: No results found for this basename: WBC, HGB, HCT, PLT,  in the last 72 hours BMET No results found for this basename: NA, K, CL, CO2, GLUCOSE, BUN, CREATININE, CALCIUM,  in the last 72 hours  Studies/Results: Dg Cervical Spine 2-3 Views  01/05/2014   CLINICAL DATA:  ANTERIOR FIXATION.  EXAM: CERVICAL SPINE - 2-3 VIEW  COMPARISON:  DG CERVICAL SPINE WITH FLEX & EXTEND dated 12/30/2013  FINDINGS: Two lateral intraoperative views. The first, labeled 0810 hr, demonstrate surgical devices projecting over the anterior aspect of the C4-5 and C5-6 levels.  The second, labeled 1016 hr, demonstrates anterior plate and screw fixation at C5-6 and likely C6-7. The most inferior aspect the plate is obscured. Endotracheal and nasogastric tubes. Underlying degenerative disc disease, including at C4-5.  IMPRESSION: Intraoperative imaging of anterior fixation.   Electronically Signed   By: Abigail Miyamoto M.D.   On: 01/05/2014 11:52    Assessment/Plan: Doing well.  D/C home.    LOS: 1 day    Peggyann Shoals, MD 01/06/2014, 8:15 AM

## 2014-01-08 ENCOUNTER — Encounter (HOSPITAL_COMMUNITY): Payer: Self-pay | Admitting: Neurosurgery

## 2017-08-04 ENCOUNTER — Emergency Department (HOSPITAL_COMMUNITY): Payer: Medicare Other

## 2017-08-04 ENCOUNTER — Emergency Department (HOSPITAL_COMMUNITY)
Admission: EM | Admit: 2017-08-04 | Discharge: 2017-08-04 | Disposition: A | Discharge status: Home / Self Care | Payer: Medicare Other | Attending: Emergency Medicine | Admitting: Emergency Medicine

## 2017-08-04 ENCOUNTER — Encounter (HOSPITAL_COMMUNITY): Payer: Self-pay | Admitting: *Deleted

## 2017-08-04 DIAGNOSIS — J449 Chronic obstructive pulmonary disease, unspecified: Secondary | ICD-10-CM | POA: Insufficient documentation

## 2017-08-04 DIAGNOSIS — Z87891 Personal history of nicotine dependence: Secondary | ICD-10-CM | POA: Insufficient documentation

## 2017-08-04 DIAGNOSIS — Z79899 Other long term (current) drug therapy: Secondary | ICD-10-CM | POA: Diagnosis not present

## 2017-08-04 DIAGNOSIS — R1013 Epigastric pain: Secondary | ICD-10-CM | POA: Insufficient documentation

## 2017-08-04 DIAGNOSIS — E039 Hypothyroidism, unspecified: Secondary | ICD-10-CM | POA: Diagnosis not present

## 2017-08-04 DIAGNOSIS — R911 Solitary pulmonary nodule: Secondary | ICD-10-CM

## 2017-08-04 DIAGNOSIS — I10 Essential (primary) hypertension: Secondary | ICD-10-CM | POA: Diagnosis not present

## 2017-08-04 DIAGNOSIS — R1010 Upper abdominal pain, unspecified: Secondary | ICD-10-CM | POA: Diagnosis present

## 2017-08-04 HISTORY — DX: Pure hypercholesterolemia, unspecified: E78.00

## 2017-08-04 LAB — CBC
HCT: 38.6 % — ABNORMAL LOW (ref 39.0–52.0)
Hemoglobin: 13.2 g/dL (ref 13.0–17.0)
MCH: 28.6 pg (ref 26.0–34.0)
MCHC: 34.2 g/dL (ref 30.0–36.0)
MCV: 83.5 fL (ref 78.0–100.0)
Platelets: 108 10*3/uL — ABNORMAL LOW (ref 150–400)
RBC: 4.62 MIL/uL (ref 4.22–5.81)
RDW: 13.2 % (ref 11.5–15.5)
WBC: 6.9 10*3/uL (ref 4.0–10.5)

## 2017-08-04 LAB — COMPREHENSIVE METABOLIC PANEL
ALT: 16 U/L — ABNORMAL LOW (ref 17–63)
AST: 20 U/L (ref 15–41)
Albumin: 4.1 g/dL (ref 3.5–5.0)
Alkaline Phosphatase: 61 U/L (ref 38–126)
Anion gap: 6 (ref 5–15)
BUN: 19 mg/dL (ref 6–20)
CO2: 30 mmol/L (ref 22–32)
Calcium: 8.9 mg/dL (ref 8.9–10.3)
Chloride: 105 mmol/L (ref 101–111)
Creatinine, Ser: 1.2 mg/dL (ref 0.61–1.24)
GFR calc Af Amer: >60 mL/min (ref >60)
GFR calc non Af Amer: 59 mL/min — ABNORMAL LOW (ref >60)
Glucose, Bld: 117 mg/dL — ABNORMAL HIGH (ref 65–99)
Potassium: 3.8 mmol/L (ref 3.5–5.1)
Sodium: 141 mmol/L (ref 135–145)
Total Bilirubin: 0.5 mg/dL (ref 0.3–1.2)
Total Protein: 6.7 g/dL (ref 6.5–8.1)

## 2017-08-04 LAB — TROPONIN I: Troponin I: <0.03 ng/mL (ref <0.03)

## 2017-08-04 LAB — LIPASE, BLOOD: Lipase: 29 U/L (ref 11–51)

## 2017-08-04 MED ORDER — FENTANYL CITRATE (PF) 100 MCG/2ML IJ SOLN
50.0000 ug | Freq: Once | INTRAMUSCULAR | Status: AC
  Administered 2017-08-04: 50 ug via INTRAVENOUS
  Filled 2017-08-04: qty 2

## 2017-08-04 MED ORDER — SUCRALFATE 1 G PO TABS: 1.0000 g | ORAL_TABLET | Freq: Three times a day (TID) | ORAL | 0 refills | Status: DC

## 2017-08-04 NOTE — ED Provider Notes (Signed)
[place] negative for GB pathology.  Suspect false positive finding on the CT scan.  Pt could be having issues with gallbladder dysfunction for ulcer, gastritis issues.  Will dc home with carafate.  Continue ppi.  Follow up with GI.  Incidental lung nodule follow up   Dorie Rank, MD 08/04/17 [phone]

## 2017-08-04 NOTE — ED Provider Notes (Addendum)
[facility] DEPT Provider Note   CSN: [id number] Arrival date & time: 08/04/17  0437  Time seen 04:50 AM   History   Chief Complaint Chief Complaint  Patient presents with  . Abdominal Pain    HPI MACE WEINBERG is a 71 y.o. male.  HPI  Patient states he was awakened from sleep at 1 AM with upper abdominal discomfort that he states was initially burning and now is sharp. It does not radiate into his back. The pain is there constantly but it waxes and wanes. He states sitting makes the pain worse and laying flat makes the pain feel better. He has had nausea and gagging without vomiting or diarrhea. He states he's never had this pain before and it feels different from his prior episodes of heartburn. He states they ate grilled pork ribs and baked beans tonight which she's had before without difficulty. He denies any prior history of heart disease, strokes or TIAs.He has not been told he has an aneurysm. He states he had a stress test about 18 months ago that was normal.  PCP Ray Church, FNP   Past Medical History:  Diagnosis Date  . Anxiety   . Arthritis   . COPD (chronic obstructive pulmonary disease) ([place])   . GERD (gastroesophageal reflux disease)   . H/O hiatal hernia   . High cholesterol   . Hypertension   . Hypothyroidism   . Seasonal allergies     Patient Active Problem List   Diagnosis Date Noted  . Cervical spondylosis with myelopathy and radiculopathy 01/05/2014  . Displacement of cervical intervertebral disc without myelopathy 01/05/2014    Past Surgical History:  Procedure Laterality Date  . ANTERIOR CERVICAL DECOMP/DISCECTOMY FUSION N/A 01/05/2014   Procedure: Cervical five-six, Cervical six-seven, Cervical seven-thoracic one anterior cervical decompression with fusion interbody prosthesis plating and bonegraft;  Surgeon: Erline Levine, MD;  Location: [facility];  Service: Neurosurgery;  Laterality: N/A;  Cervical five-six, Cervical six-seven, Cervical  seven-thoracic one anterior cervical decompression with fusion interbody prosthesis plating  . EYE SURGERY Bilateral    lazy eye surgery  . FRACTURE SURGERY Right    foot  . FRACTURE SURGERY Right    collar bone  . HERNIA REPAIR     umbilical hernia repair  . KNEE SURGERY    . SHOULDER ARTHROSCOPY Right   . TONSILLECTOMY         Home Medications    Prior to Admission medications   Medication Sig Start Date End Date Taking? Authorizing Provider  levothyroxine (SYNTHROID, LEVOTHROID) 150 MCG tablet Take 137 mcg by mouth daily before breakfast.    Yes [provider]  lisinopril-hydrochlorothiazide (PRINZIDE,ZESTORETIC) 20-12.5 MG per tablet Take 1 tablet by mouth daily.   Yes [provider]  omeprazole (PRILOSEC) 20 MG capsule Take 20 mg by mouth daily.   Yes [provider]  simvastatin (ZOCOR) 20 MG tablet Take 20 mg by mouth daily.   Yes [provider]  albuterol (PROVENTIL HFA;VENTOLIN HFA) 108 (90 BASE) MCG/ACT inhaler Inhale 2 puffs into the lungs every 6 (six) hours as needed for wheezing or shortness of breath.    [provider]  cyclobenzaprine (FLEXERIL) 10 MG tablet Take 10 mg by mouth daily as needed for muscle spasms.    [provider]  diazepam (VALIUM) 5 MG tablet Take 1 tablet (5 mg total) by mouth every 6 (six) hours as needed for muscle spasms. 01/06/14   Erline Levine, MD  gabapentin (NEURONTIN) 300  MG capsule Take 300 mg by mouth at bedtime.    [provider]  montelukast (SINGULAIR) 10 MG tablet Take 10 mg by mouth daily.    [provider]  oxyCODONE-acetaminophen (PERCOCET/ROXICET) 5-325 MG per tablet Take 1-2 tablets by mouth every 4 (four) hours as needed for moderate pain. 01/06/14   Erline Levine, MD  rOPINIRole (REQUIP) 1 MG tablet Take 1 mg by mouth at bedtime as needed (restless legs).    [provider]  tiotropium (SPIRIVA) 18 MCG inhalation capsule Place 18 mcg into inhaler  and inhale daily as needed (allergies/congestion).    [provider]  traMADol (ULTRAM) 50 MG tablet Take 50 mg by mouth every 6 (six) hours as needed for moderate pain.    [provider]    Family History History reviewed. No pertinent family history.  Social History Social History  Substance Use Topics  . Smoking status: Former Smoker    Packs/day: 1.00    Years: 40.00    Types: Cigarettes    Quit date: 11/22/2013  . Smokeless tobacco: Never Used  . Alcohol use Yes     Comment: social  lives at home Lives with spouse   Allergies   Biaxin [clarithromycin] and Meloxicam   Review of Systems Review of Systems  All other systems reviewed and are negative.    Physical Exam Updated Vital Signs BP 109/73   Pulse 68   Temp (!) 97.4 F (36.3 C) (Oral)   Resp 15   Ht 5\' 9"  (1.753 m)   Wt 88.5 kg (195 lb)   SpO2 94%   BMI 28.80 kg/m    Vital signs normal    Physical Exam  Constitutional: He is oriented to person, place, and time. He appears well-developed and well-nourished.  Non-toxic appearance. He does not appear ill. No distress.  HENT:  Head: Normocephalic and atraumatic.  Right Ear: External ear normal.  Left Ear: External ear normal.  Nose: Nose normal. No mucosal edema or rhinorrhea.  Mouth/Throat: Oropharynx is clear and moist and mucous membranes are normal. No dental abscesses or uvula swelling.  Eyes: Pupils are equal, round, and reactive to light. Conjunctivae and EOM are normal.  Neck: Normal range of motion and full passive range of motion without pain. Neck supple.  Cardiovascular: Normal rate, regular rhythm and normal heart sounds.  Exam reveals no gallop and no friction rub.   No murmur heard. Pulmonary/Chest: Effort normal and breath sounds normal. No respiratory distress. He has no wheezes. He has no rhonchi. He has no rales. He exhibits no tenderness and no crepitus.  Abdominal: Soft. Normal appearance and bowel sounds are  normal. He exhibits no distension. There is no tenderness. There is no rebound and no guarding.    Area of pain noted  Musculoskeletal: Normal range of motion. He exhibits no edema or tenderness.  Moves all extremities well.   Neurological: He is alert and oriented to person, place, and time. He has normal strength. No cranial nerve deficit.  Skin: Skin is warm, dry and intact. No rash noted. No erythema. No pallor.  Psychiatric: He has a normal mood and affect. His speech is normal and behavior is normal. His mood appears not anxious.  Nursing note and vitals reviewed.    ED Treatments / Results  Labs (all labs ordered are listed, but only abnormal results are displayed)  Results for orders placed or performed during the hospital encounter of 08/04/17  Lipase, blood  Result Value Ref  Range   Lipase 29 11 - 51 U/L  Comprehensive metabolic panel  Result Value Ref Range   Sodium 141 135 - 145 mmol/L   Potassium 3.8 3.5 - 5.1 mmol/L   Chloride 105 101 - 111 mmol/L   CO2 30 22 - 32 mmol/L   Glucose, Bld 117 (H) 65 - 99 mg/dL   BUN 19 6 - 20 mg/dL   Creatinine, Ser 1.20 0.61 - 1.24 mg/dL   Calcium 8.9 8.9 - 10.3 mg/dL   Total Protein 6.7 6.5 - 8.1 g/dL   Albumin 4.1 3.5 - 5.0 g/dL   AST 20 15 - 41 U/L   ALT 16 (L) 17 - 63 U/L   Alkaline Phosphatase 61 38 - 126 U/L   Total Bilirubin 0.5 0.3 - 1.2 mg/dL   GFR calc non Af Amer 59 (L) >60 mL/min   GFR calc Af Amer >60 >60 mL/min   Anion gap 6 5 - 15  CBC  Result Value Ref Range   WBC 6.9 4.0 - 10.5 K/uL   RBC 4.62 4.22 - 5.81 MIL/uL   Hemoglobin 13.2 13.0 - 17.0 g/dL   HCT 38.6 (L) 39.0 - 52.0 %   MCV 83.5 78.0 - 100.0 fL   MCH 28.6 26.0 - 34.0 pg   MCHC 34.2 30.0 - 36.0 g/dL   RDW 13.2 11.5 - 15.5 %   Platelets 108 (L) 150 - 400 K/uL  Troponin I  Result Value Ref Range   Troponin I <0.03 <0.03 ng/mL   Laboratory interpretation all normal     Results for orders placed or performed during the hospital encounter of  01/01/14  Surgical pcr screen  Result Value Ref Range   MRSA, PCR NEGATIVE NEGATIVE   Staphylococcus aureus NEGATIVE NEGATIVE  Basic metabolic panel  Result Value Ref Range   Sodium 136 (L) 137 - 147 mEq/L   Potassium 3.9 3.7 - 5.3 mEq/L   Chloride 98 96 - 112 mEq/L   CO2 27 19 - 32 mEq/L   Glucose, Bld 96 70 - 99 mg/dL   BUN 16 6 - 23 mg/dL   Creatinine, Ser 1.10 0.50 - 1.35 mg/dL   Calcium 9.2 8.4 - 10.5 mg/dL   GFR calc non Af Amer 67 (L) >90 mL/min   GFR calc Af Amer 78 (L) >90 mL/min  CBC  Result Value Ref Range   WBC 7.6 4.0 - 10.5 K/uL   RBC 4.44 4.22 - 5.81 MIL/uL   Hemoglobin 13.6 13.0 - 17.0 g/dL   HCT 37.9 (L) 39.0 - 52.0 %   MCV 85.4 78.0 - 100.0 fL   MCH 30.6 26.0 - 34.0 pg   MCHC 35.9 30.0 - 36.0 g/dL   RDW 12.6 11.5 - 15.5 %   Platelets 192 150 - 400 K/uL  Type and screen  Result Value Ref Range   ABO/RH(D) A POS    Antibody Screen NEG    Sample Expiration 01/15/2014   ABO/Rh  Result Value Ref Range   ABO/RH(D) A POS       EKG  EKG Interpretation  Date/Time:  Sunday August 04 2017 04:46:51 EDT Ventricular Rate:  64 PR Interval:    QRS Duration: 79 QT Interval:  399 QTC Calculation: 412 R Axis:   58 Text Interpretation:  Sinus rhythm Minimal ST elevation, inferior leads No significant change since last tracing 01 Jan 2014 Confirmed by Rolland Porter [phone]) on 08/04/2017 4:49:23 AM       Radiology Ct  Renal Stone Study  Result Date: 08/04/2017 CLINICAL DATA:  Epigastric pain with nausea. Awakened patient from sleep. EXAM: CT ABDOMEN AND PELVIS WITHOUT CONTRAST TECHNIQUE: Multidetector CT imaging of the abdomen and pelvis was performed following the standard protocol without IV contrast. COMPARISON:  None. FINDINGS: Lower chest: No acute findings. Noncalcified 6 mm nodule in the lateral right base. Hepatobiliary: 3 cm water density lesion of the left hepatic lobe, not characterized but more likely a cyst. Otherwise unremarkable liver. Mild gallbladder  distention and mural thickening. No bile duct dilatation. Pancreas: Unremarkable. No pancreatic ductal dilatation or surrounding inflammatory changes. Spleen: Normal in size without focal abnormality. Adrenals/Urinary Tract: Adrenal glands are unremarkable. Kidneys are normal, without renal calculi, focal lesion, or hydronephrosis. Bladder is unremarkable. Stomach/Bowel: Stomach is within normal limits. Colon appears normal. No evidence of bowel wall thickening, distention, or inflammatory changes. Vascular/Lymphatic: The abdominal aorta is normal in caliber with mild atherosclerotic calcification. No adenopathy in the abdomen or pelvis. Reproductive: Unremarkable Other: No ascites. Musculoskeletal: No significant skeletal lesion. IMPRESSION: 1. Gallbladder distention and mild gallbladder mural thickening. Consider right upper quadrant sonography for better evaluation. 2. Noncalcified 6 mm right base lung nodule. Non-contrast chest CT at 6-12 months is recommended. If the nodule is stable at time of repeat CT, then future CT at 18-24 months (from today's scan) is considered optional for low-risk patients, but is recommended for high-risk patients. This recommendation follows the consensus statement: Guidelines for Management of Incidental Pulmonary Nodules Detected on CT Images: From the Fleischner Society 2017; Radiology 2017; 284:228-243. 3. Aortic atherosclerosis. 4. Low-attenuation 3 cm left lobe liver lesion, not characterized but more likely benign. Electronically Signed   By: Andreas Newport M.D.   On: 08/04/2017 05:54    Procedures Procedures (including critical care time)  Medications Ordered in ED Medications  fentaNYL (SUBLIMAZE) injection 50 mcg (50 mcg Intravenous Given 08/04/17 0508)     Initial Impression / Assessment and Plan / ED Course  I have reviewed the triage vital signs and the nursing notes.  Pertinent labs & imaging results that were available during my care of the patient  were reviewed by me and considered in my medical decision making (see chart for details).     Patient was given IV pain medication. CT scan the abdomen was done to look for AAA. Patient is in the right age group. He does not have any other known vascular disease however. He has high cholesterol. His initial EKG does not show evidence of an acute MI. Cardiac testing was also done. Patient also still has his gallbladder he could have gallstones.  Recheck at 6:05 AM patient states his pain is gone. We discussed his CT results. His laboratory tests have not resulted yet. We discussed getting an ultrasound this morning.  Ultrasound the right upper quadrant was ordered.  Recheck at 6:50 AM patient was given the results of his laboratory testing. He states he still pain-free. I advised the patient if his pain returns to let the nurse know right away. They understand we are waiting for the ultrasound tech to come to the hospital this morning to get his study done. He will be turned over to the a shift physician to get the results of his CT scan. They are very agreeable that if he needs to be admitted to have admission and have the gallbladder removed this visit. Delta troponin will be orderd for 8 AM.  08:00 AM patient left with Dr Hillard Danker to get the results  of his [place] RUQ and delta troponin  Final Clinical Impressions(s) / ED Diagnoses   Final diagnoses:  Epigastric abdominal pain    Disposition pending  Rolland Porter, MD, Barbette Or, MD 08/04/17 Wonda Amis    Rolland Porter, MD 08/04/17 [phone]

## 2017-08-04 NOTE — Discharge Instructions (Signed)
Take the medications as prescribed, follow-up with a gastroenterologist for further evaluation as we discussed, return as needed.  An incidental lung nodule was noted on the CT.  Routine imaging in approx 6 months is recommended to make sure it is not changing in size.

## 2017-08-04 NOTE — ED Triage Notes (Signed)
Pt c/o epigastric pain with nausea and heartburn that woke him up from sleep, did take tums, MOM with no change in pain,

## 2017-08-07 ENCOUNTER — Encounter (INDEPENDENT_AMBULATORY_CARE_PROVIDER_SITE_OTHER): Payer: Self-pay | Admitting: Internal Medicine

## 2017-08-07 ENCOUNTER — Ambulatory Visit (INDEPENDENT_AMBULATORY_CARE_PROVIDER_SITE_OTHER): Payer: Medicare Other | Admitting: Internal Medicine

## 2017-08-07 VITALS — BP 116/68 | HR 72 | Temp 97.9°F | Ht 70.0 in | Wt 194.1 lb

## 2017-08-07 DIAGNOSIS — G8929 Other chronic pain: Secondary | ICD-10-CM | POA: Diagnosis not present

## 2017-08-07 DIAGNOSIS — K769 Liver disease, unspecified: Secondary | ICD-10-CM | POA: Diagnosis not present

## 2017-08-07 DIAGNOSIS — R1013 Epigastric pain: Secondary | ICD-10-CM

## 2017-08-07 HISTORY — DX: Other chronic pain: G89.29

## 2017-08-07 NOTE — Patient Instructions (Signed)
HIDA scan, labs, EKG

## 2017-08-07 NOTE — Progress Notes (Signed)
Subjective:    Patient ID: Francisco Butler, male    DOB: 1946-01-24, 71 y.o.   MRN: [id number] Referred by [facility] ED PCP Fanny Dance FNP HPI Recently seen in the ED 08/04/2017 with Epigastric pain. [place] negative for GB pathology.   The pain was under both rib cases and epigastric pain. Rated the pain at a 10/10. The pain lasted for about 30 minutes or more and then eased off and then returned. He denies having chest pain. The pain started while asleep in bed. He thought it was food poison form eating pork rib.  He says he has had the pain start but will resolve quickly now. No nausea. No fever. He thought if he could vomit during that time, he would feel better.  Troponin was normal. Minimal ST elevation.  He was in no pain at [facility].  He was seen at [facility] but left due to the wait.    08/04/2017 [place] RUQ: epigastric pain Normal.  CBC 5.1 mm.   08/04/2017 Lipase 29 Troponin: less than 0.03 CBC    Component Value Date/Time   WBC 6.9 08/04/2017 0451   RBC 4.62 08/04/2017 0451   HGB 13.2 08/04/2017 0451   HCT 38.6 (L) 08/04/2017 0451   PLT 108 (L) 08/04/2017 0451   MCV 83.5 08/04/2017 0451   MCH 28.6 08/04/2017 0451   MCHC 34.2 08/04/2017 0451   RDW 13.2 08/04/2017 0451   CMP Latest Ref Rng & Units 08/04/2017 01/01/2014  Glucose 65 - 99 mg/dL 117(H) 96  BUN 6 - 20 mg/dL 19 16  Creatinine 0.61 - 1.24 mg/dL 1.20 1.10  Sodium 135 - 145 mmol/L 141 136(L)  Potassium 3.5 - 5.1 mmol/L 3.8 3.9  Chloride 101 - 111 mmol/L 105 98  CO2 22 - 32 mmol/L 30 27  Calcium 8.9 - 10.3 mg/dL 8.9 9.2  Total Protein 6.5 - 8.1 g/dL 6.7 -  Total Bilirubin 0.3 - 1.2 mg/dL 0.5 -  Alkaline Phos 38 - 126 U/L 61 -  AST 15 - 41 U/L 20 -  ALT 17 - 63 U/L 16(L) -    08/04/2017 CT Renal Stone Study: epigastric pian with nausea IMPRESSION: 1. Gallbladder distention and mild gallbladder mural thickening. Consider right upper quadrant sonography for better evaluation. 2. Noncalcified 6 mm right base lung nodule.  Non-contrast chest CT at 6-12 months is recommended. If the nodule is stable at time of repeat CT, then future CT at 18-24 months (from today's scan) is   Review of Systems Past Medical History:  Diagnosis Date  . Abdominal pain, chronic, epigastric 08/07/2017  . Anxiety   . Arthritis   . COPD (chronic obstructive pulmonary disease) ([place])   . GERD (gastroesophageal reflux disease)   . H/O hiatal hernia   . High cholesterol   . Hypertension   . Hypothyroidism   . Seasonal allergies     Past Surgical History:  Procedure Laterality Date  . ANTERIOR CERVICAL DECOMP/DISCECTOMY FUSION N/A 01/05/2014   Procedure: Cervical five-six, Cervical six-seven, Cervical seven-thoracic one anterior cervical decompression with fusion interbody prosthesis plating and bonegraft;  Surgeon: Erline Levine, MD;  Location: [facility];  Service: Neurosurgery;  Laterality: N/A;  Cervical five-six, Cervical six-seven, Cervical seven-thoracic one anterior cervical decompression with fusion interbody prosthesis plating  . EYE SURGERY Bilateral    lazy eye surgery  . FRACTURE SURGERY Right    foot  . FRACTURE SURGERY Right    collar bone  . HERNIA REPAIR  umbilical hernia repair  . KNEE SURGERY    . SHOULDER ARTHROSCOPY Right   . TONSILLECTOMY      Allergies  Allergen Reactions  . Biaxin [Clarithromycin] Other (See Comments)    hiccups   . Meloxicam Other (See Comments)    ulcers    Current Outpatient Prescriptions on File Prior to Visit  Medication Sig Dispense Refill  . albuterol (PROVENTIL HFA;VENTOLIN HFA) 108 (90 BASE) MCG/ACT inhaler Inhale 2 puffs into the lungs every 6 (six) hours as needed for wheezing or shortness of breath.    . cyclobenzaprine (FLEXERIL) 10 MG tablet Take 10 mg by mouth daily as needed for muscle spasms.    . diazepam (VALIUM) 5 MG tablet Take 1 tablet (5 mg total) by mouth every 6 (six) hours as needed for muscle spasms. 60 tablet 0  . Levothyroxine Sodium 137 MCG CAPS  Take 137 mcg by mouth daily before breakfast.     . lisinopril-hydrochlorothiazide (PRINZIDE,ZESTORETIC) 20-12.5 MG per tablet Take 1 tablet by mouth daily.    [name] omeprazole (PRILOSEC) 20 MG capsule Take 20 mg by mouth daily.    [name] oxyCODONE-acetaminophen (PERCOCET/ROXICET) 5-325 MG per tablet Take 1-2 tablets by mouth every 4 (four) hours as needed for moderate pain. 80 tablet 0  . rOPINIRole (REQUIP) 1 MG tablet Take 1 mg by mouth at bedtime as needed (restless legs).    . simvastatin (ZOCOR) 20 MG tablet Take 20 mg by mouth daily.    . sucralfate (CARAFATE) 1 g tablet Take 1 tablet (1 g total) by mouth 4 (four) times daily -  with meals and at bedtime. 28 tablet 0  . tiotropium (SPIRIVA) 18 MCG inhalation capsule Place 18 mcg into inhaler and inhale daily as needed (allergies/congestion).    . traMADol (ULTRAM) 50 MG tablet Take 50 mg by mouth every 6 (six) hours as needed for moderate pain.     No current facility-administered medications on file prior to visit.         Objective:   Physical Exam Blood pressure 116/68, pulse 72, temperature 97.9 F (36.6 C), height 5\' 10"  (1.778 m), weight 194 lb 1.6 oz (88 kg). Alert and oriented. Skin warm and dry. Oral mucosa is moist.   . Sclera anicteric, conjunctivae is pink. Thyroid not enlarged. No cervical lymphadenopathy. Lungs clear. Heart regular rate and rhythm.  Abdomen is soft. Bowel sounds are positive. No hepatomegaly. No abdominal masses felt. No tenderness.  No edema to lower extremities.           Assessment & Plan:  Epigastric pain: ? Etiology. He did have slight ST elevation on EKG.  AFP, HIDA scan. Troponin.  Thrombocytopenia: ? Etiology. 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

## 2017-08-08 LAB — CBC WITH DIFFERENTIAL/PLATELET
Basophils Absolute: 31 cells/uL (ref 0–200)
Basophils Relative: 0.5 %
Eosinophils Absolute: 98 cells/uL (ref 15–500)
Eosinophils Relative: 1.6 %
HCT: 37.2 % — ABNORMAL LOW (ref 38.5–50.0)
HEMOGLOBIN: 12.5 g/dL — AB (ref 13.2–17.1)
LYMPHS ABS: 1452 {cells}/uL (ref 850–3900)
MCH: 28 pg (ref 27.0–33.0)
MCHC: 33.6 g/dL (ref 32.0–36.0)
MCV: 83.2 fL (ref 80.0–100.0)
MPV: 10.8 fL (ref 7.5–12.5)
Monocytes Relative: 8.2 %
NEUTROS PCT: 65.9 %
Neutro Abs: 4020 cells/uL (ref 1500–7800)
Platelets: 124 10*3/uL — ABNORMAL LOW (ref 140–400)
RBC: 4.47 10*6/uL (ref 4.20–5.80)
RDW: 13.1 % (ref 11.0–15.0)
Total Lymphocyte: 23.8 %
WBC: 6.1 10*3/uL (ref 3.8–10.8)
WBCMIX: 500 {cells}/uL (ref 200–950)

## 2017-08-08 LAB — AFP TUMOR MARKER: AFP-Tumor Marker: 2.9 ng/mL (ref <6.1)

## 2017-08-09 ENCOUNTER — Ambulatory Visit (HOSPITAL_COMMUNITY)
Admission: RE | Admit: 2017-08-09 | Discharge: 2017-08-09 | Disposition: A | Discharge status: Home / Self Care | Payer: Medicare Other | Source: Ambulatory Visit | Attending: Internal Medicine | Admitting: Internal Medicine

## 2017-08-09 DIAGNOSIS — R1013 Epigastric pain: Secondary | ICD-10-CM | POA: Insufficient documentation

## 2017-08-12 ENCOUNTER — Other Ambulatory Visit (INDEPENDENT_AMBULATORY_CARE_PROVIDER_SITE_OTHER): Payer: Self-pay | Admitting: *Deleted

## 2017-08-12 DIAGNOSIS — R1013 Epigastric pain: Secondary | ICD-10-CM

## 2017-08-13 ENCOUNTER — Encounter (HOSPITAL_COMMUNITY)
Admission: RE | Admit: 2017-08-13 | Discharge: 2017-08-13 | Disposition: A | Discharge status: Home / Self Care | Payer: Medicare Other | Source: Ambulatory Visit | Attending: Internal Medicine | Admitting: Internal Medicine

## 2017-08-13 ENCOUNTER — Telehealth (INDEPENDENT_AMBULATORY_CARE_PROVIDER_SITE_OTHER): Payer: Self-pay | Admitting: Internal Medicine

## 2017-08-13 ENCOUNTER — Encounter (HOSPITAL_COMMUNITY): Payer: Self-pay

## 2017-08-13 DIAGNOSIS — R1013 Epigastric pain: Secondary | ICD-10-CM | POA: Diagnosis not present

## 2017-08-13 DIAGNOSIS — R948 Abnormal results of function studies of other organs and systems: Secondary | ICD-10-CM

## 2017-08-13 MED ORDER — TECHNETIUM TC 99M MEBROFENIN IV KIT
5.0000 | PACK | Freq: Once | INTRAVENOUS | Status: AC | PRN
  Administered 2017-08-13: 5.6 via INTRAVENOUS

## 2017-08-13 MED ORDER — SODIUM CHLORIDE 0.9% FLUSH
INTRAVENOUS | Status: AC
  Filled 2017-08-13: qty 180

## 2017-08-13 NOTE — Telephone Encounter (Signed)
Referral to Dr. Jenkins 

## 2017-08-20 ENCOUNTER — Ambulatory Visit (INDEPENDENT_AMBULATORY_CARE_PROVIDER_SITE_OTHER): Payer: Medicare Other | Admitting: General Surgery

## 2017-08-20 ENCOUNTER — Encounter: Payer: Self-pay | Admitting: General Surgery

## 2017-08-20 VITALS — BP 130/75 | HR 71 | Temp 98.6°F | Resp 18 | Ht 70.0 in | Wt 197.0 lb

## 2017-08-20 DIAGNOSIS — K811 Chronic cholecystitis: Secondary | ICD-10-CM

## 2017-08-20 NOTE — Progress Notes (Signed)
Francisco Butler; [id number]; [birth year]   HPI Patient is a 71 year old white male who was referred to my care by Dr. Laural Golden for evaluation and treatment of abnormal hepatobiliary scan. Patient was seen in the ER earlier this month with right upper quadrant abdominal pain. This occurred while he was sleeping. Ultrasound the time was negative for gallbladder disease. An hepatobiliary scan was performed which revealed a 0 gallbladder ejection fraction. He did have reproducible symptoms with Ensure. He states he has had intermittent episodes of upper abdominal pain after fatty meal. He currently has no pain. He denies any fever, chills, or jaundice. Past Medical History:  Diagnosis Date  . Abdominal pain, chronic, epigastric 08/07/2017  . Anxiety   . Arthritis   . COPD (chronic obstructive pulmonary disease) ([place])   . GERD (gastroesophageal reflux disease)   . H/O hiatal hernia   . High cholesterol   . Hypertension   . Hypothyroidism   . Seasonal allergies     Past Surgical History:  Procedure Laterality Date  . ANTERIOR CERVICAL DECOMP/DISCECTOMY FUSION N/A 01/05/2014   Procedure: Cervical five-six, Cervical six-seven, Cervical seven-thoracic one anterior cervical decompression with fusion interbody prosthesis plating and bonegraft;  Surgeon: Erline Levine, MD;  Location: [facility];  Service: Neurosurgery;  Laterality: N/A;  Cervical five-six, Cervical six-seven, Cervical seven-thoracic one anterior cervical decompression with fusion interbody prosthesis plating  . EYE SURGERY Bilateral    lazy eye surgery  . FRACTURE SURGERY Right    foot  . FRACTURE SURGERY Right    collar bone  . HERNIA REPAIR     umbilical hernia repair  . KNEE SURGERY    . SHOULDER ARTHROSCOPY Right   . TONSILLECTOMY      No family history on file.  Current Outpatient Prescriptions on File Prior to Visit  Medication Sig Dispense Refill  . albuterol (PROVENTIL HFA;VENTOLIN HFA) 108 (90 BASE) MCG/ACT inhaler  Inhale 2 puffs into the lungs every 6 (six) hours as needed for wheezing or shortness of breath.    . cyclobenzaprine (FLEXERIL) 10 MG tablet Take 10 mg by mouth daily as needed for muscle spasms.    . diazepam (VALIUM) 5 MG tablet Take 1 tablet (5 mg total) by mouth every 6 (six) hours as needed for muscle spasms. 60 tablet 0  . Ginkgo Biloba 120 MG CAPS Take by mouth.    . Levothyroxine Sodium 137 MCG CAPS Take 137 mcg by mouth daily before breakfast.     . lisinopril-hydrochlorothiazide (PRINZIDE,ZESTORETIC) 20-12.5 MG per tablet Take 1 tablet by mouth daily.    [name] omeprazole (PRILOSEC) 20 MG capsule Take 20 mg by mouth daily.    [name] oxyCODONE-acetaminophen (PERCOCET/ROXICET) 5-325 MG per tablet Take 1-2 tablets by mouth every 4 (four) hours as needed for moderate pain. 80 tablet 0  . rOPINIRole (REQUIP) 1 MG tablet Take 1 mg by mouth at bedtime as needed (restless legs).    . simvastatin (ZOCOR) 20 MG tablet Take 20 mg by mouth daily.    . sucralfate (CARAFATE) 1 g tablet Take 1 tablet (1 g total) by mouth 4 (four) times daily -  with meals and at bedtime. 28 tablet 0  . tiotropium (SPIRIVA) 18 MCG inhalation capsule Place 18 mcg into inhaler and inhale daily as needed (allergies/congestion).    . traMADol (ULTRAM) 50 MG tablet Take 50 mg by mouth every 6 (six) hours as needed for moderate pain.     No current facility-administered medications on file prior to visit.  Allergies  Allergen Reactions  . Biaxin [Clarithromycin] Other (See Comments)    hiccups   . Meloxicam Other (See Comments)    ulcers    History  Alcohol Use  . Yes    Comment: social    History  Smoking Status  . Former Smoker  . Packs/day: 1.00  . Years: 40.00  . Types: Cigarettes  . Quit date: 11/22/2013  Smokeless Tobacco  . Never Used    Review of Systems  Constitutional: Positive for malaise/fatigue.  HENT: Negative.   Eyes: Negative.   Respiratory: Negative.   Cardiovascular: Negative.    Gastrointestinal: Positive for abdominal pain and heartburn.  Genitourinary: Negative.   Musculoskeletal: Positive for joint pain.  Skin: Negative.   Neurological: Negative.   Endo/Heme/Allergies: Negative.   Psychiatric/Behavioral: Negative.     Objective   Vitals:   08/20/17 1333  BP: 130/75  Pulse: 71  Resp: 18  Temp: 98.6 F (37 C)    Physical Exam  Constitutional: He is oriented to person, place, and time and well-developed, well-nourished, and in no distress.  HENT:  Head: Normocephalic and atraumatic.  Eyes: No scleral icterus.  Cardiovascular: Normal rate, regular rhythm and normal heart sounds.  Exam reveals no gallop and no friction rub.   No murmur heard. Pulmonary/Chest: Effort normal and breath sounds normal. No respiratory distress. He has no wheezes. He has no rales.  Abdominal: Soft. Bowel sounds are normal. He exhibits no distension. There is no tenderness. There is no rebound.  Neurological: He is alert and oriented to person, place, and time.  Skin: Skin is warm and dry.  Vitals reviewed.  ER notes, Dr. Olevia Perches office notes were reviewed. HIDA scan report reviewed. Assessment  Chronic cholecystitis Plan   Patient is scheduled for laparoscopic cholecystectomy on 08/23/2017. The risks and benefits of the procedure including bleeding, infection, hepatobiliary injury, and the possibility of an open procedure were fully explained to the patient, who gave informed consent.

## 2017-08-20 NOTE — H&P (Signed)
Francisco Butler; [id number]; Feb 10, 1946   HPI Patient is a 71 year old white male who was referred to my care by Dr. Laural Butler for evaluation and treatment of abnormal hepatobiliary scan. Patient was seen in the ER earlier this month with right upper quadrant abdominal pain. This occurred while he was sleeping. Ultrasound the time was negative for gallbladder disease. An hepatobiliary scan was performed which revealed a 0 gallbladder ejection fraction. He did have reproducible symptoms with Ensure. He states he has had intermittent episodes of upper abdominal pain after fatty meal. He currently has no pain. He denies any fever, chills, or jaundice. Past Medical History:  Diagnosis Date  . Abdominal pain, chronic, epigastric 08/07/2017  . Anxiety   . Arthritis   . COPD (chronic obstructive pulmonary disease) ([place])   . GERD (gastroesophageal reflux disease)   . H/O hiatal hernia   . High cholesterol   . Hypertension   . Hypothyroidism   . Seasonal allergies     Past Surgical History:  Procedure Laterality Date  . ANTERIOR CERVICAL DECOMP/DISCECTOMY FUSION N/A 01/05/2014   Procedure: Cervical five-six, Cervical six-seven, Cervical seven-thoracic one anterior cervical decompression with fusion interbody prosthesis plating and bonegraft;  Surgeon: Erline Levine, MD;  Location: [facility];  Service: Neurosurgery;  Laterality: N/A;  Cervical five-six, Cervical six-seven, Cervical seven-thoracic one anterior cervical decompression with fusion interbody prosthesis plating  . EYE SURGERY Bilateral    lazy eye surgery  . FRACTURE SURGERY Right    foot  . FRACTURE SURGERY Right    collar bone  . HERNIA REPAIR     umbilical hernia repair  . KNEE SURGERY    . SHOULDER ARTHROSCOPY Right   . TONSILLECTOMY      No family history on file.  Current Outpatient Prescriptions on File Prior to Visit  Medication Sig Dispense Refill  . albuterol (PROVENTIL HFA;VENTOLIN HFA) 108 (90 BASE) MCG/ACT inhaler  Inhale 2 puffs into the lungs every 6 (six) hours as needed for wheezing or shortness of breath.    . cyclobenzaprine (FLEXERIL) 10 MG tablet Take 10 mg by mouth daily as needed for muscle spasms.    . diazepam (VALIUM) 5 MG tablet Take 1 tablet (5 mg total) by mouth every 6 (six) hours as needed for muscle spasms. 60 tablet 0  . Ginkgo Biloba 120 MG CAPS Take by mouth.    . Levothyroxine Sodium 137 MCG CAPS Take 137 mcg by mouth daily before breakfast.     . lisinopril-hydrochlorothiazide (PRINZIDE,ZESTORETIC) 20-12.5 MG per tablet Take 1 tablet by mouth daily.    [name] omeprazole (PRILOSEC) 20 MG capsule Take 20 mg by mouth daily.    [name] oxyCODONE-acetaminophen (PERCOCET/ROXICET) 5-325 MG per tablet Take 1-2 tablets by mouth every 4 (four) hours as needed for moderate pain. 80 tablet 0  . rOPINIRole (REQUIP) 1 MG tablet Take 1 mg by mouth at bedtime as needed (restless legs).    . simvastatin (ZOCOR) 20 MG tablet Take 20 mg by mouth daily.    . sucralfate (CARAFATE) 1 g tablet Take 1 tablet (1 g total) by mouth 4 (four) times daily -  with meals and at bedtime. 28 tablet 0  . tiotropium (SPIRIVA) 18 MCG inhalation capsule Place 18 mcg into inhaler and inhale daily as needed (allergies/congestion).    . traMADol (ULTRAM) 50 MG tablet Take 50 mg by mouth every 6 (six) hours as needed for moderate pain.     No current facility-administered medications on file prior to visit.  Allergies  Allergen Reactions  . Biaxin [Clarithromycin] Other (See Comments)    hiccups   . Meloxicam Other (See Comments)    ulcers    History  Alcohol Use  . Yes    Comment: social    History  Smoking Status  . Former Smoker  . Packs/day: 1.00  . Years: 40.00  . Types: Cigarettes  . Quit date: 11/22/2013  Smokeless Tobacco  . Never Used    Review of Systems  Constitutional: Positive for malaise/fatigue.  HENT: Negative.   Eyes: Negative.   Respiratory: Negative.   Cardiovascular: Negative.    Gastrointestinal: Positive for abdominal pain and heartburn.  Genitourinary: Negative.   Musculoskeletal: Positive for joint pain.  Skin: Negative.   Neurological: Negative.   Endo/Heme/Allergies: Negative.   Psychiatric/Behavioral: Negative.     Objective   Vitals:   08/20/17 1333  BP: 130/75  Pulse: 71  Resp: 18  Temp: 98.6 F (37 C)    Physical Exam  Constitutional: He is oriented to person, place, and time and well-developed, well-nourished, and in no distress.  HENT:  Head: Normocephalic and atraumatic.  Eyes: No scleral icterus.  Cardiovascular: Normal rate, regular rhythm and normal heart sounds.  Exam reveals no gallop and no friction rub.   No murmur heard. Pulmonary/Chest: Effort normal and breath sounds normal. No respiratory distress. He has no wheezes. He has no rales.  Abdominal: Soft. Bowel sounds are normal. He exhibits no distension. There is no tenderness. There is no rebound.  Neurological: He is alert and oriented to person, place, and time.  Skin: Skin is warm and dry.  Vitals reviewed.  ER notes, Dr. Olevia Perches office notes were reviewed. HIDA scan report reviewed. Assessment  Chronic cholecystitis Plan   Patient is scheduled for laparoscopic cholecystectomy on 08/23/2017. The risks and benefits of the procedure including bleeding, infection, hepatobiliary injury, and the possibility of an open procedure were fully explained to the patient, who gave informed consent.

## 2017-08-20 NOTE — Patient Instructions (Signed)

## 2017-08-22 ENCOUNTER — Ambulatory Visit: Payer: Self-pay | Admitting: General Surgery

## 2017-08-22 ENCOUNTER — Encounter (HOSPITAL_COMMUNITY)
Admission: RE | Admit: 2017-08-22 | Discharge: 2017-08-22 | Disposition: A | Discharge status: Home / Self Care | Payer: Medicare Other | Source: Ambulatory Visit | Attending: General Surgery | Admitting: General Surgery

## 2017-08-22 ENCOUNTER — Other Ambulatory Visit (HOSPITAL_COMMUNITY): Payer: Medicare Other

## 2017-08-23 ENCOUNTER — Ambulatory Visit (HOSPITAL_COMMUNITY)
Admission: RE | Admit: 2017-08-23 | Discharge: 2017-08-23 | Disposition: A | Discharge status: Home / Self Care | Payer: Medicare Other | Source: Ambulatory Visit | Attending: General Surgery | Admitting: General Surgery

## 2017-08-23 ENCOUNTER — Ambulatory Visit (HOSPITAL_COMMUNITY): Payer: Medicare Other | Admitting: Anesthesiology

## 2017-08-23 ENCOUNTER — Encounter (HOSPITAL_COMMUNITY)
Admission: RE | Disposition: A | Discharge status: Home / Self Care | Payer: Self-pay | Source: Ambulatory Visit | Attending: General Surgery

## 2017-08-23 ENCOUNTER — Encounter (HOSPITAL_COMMUNITY): Payer: Self-pay | Admitting: Anesthesiology

## 2017-08-23 DIAGNOSIS — K811 Chronic cholecystitis: Secondary | ICD-10-CM

## 2017-08-23 DIAGNOSIS — E039 Hypothyroidism, unspecified: Secondary | ICD-10-CM | POA: Diagnosis not present

## 2017-08-23 DIAGNOSIS — I10 Essential (primary) hypertension: Secondary | ICD-10-CM | POA: Insufficient documentation

## 2017-08-23 DIAGNOSIS — Z87891 Personal history of nicotine dependence: Secondary | ICD-10-CM | POA: Diagnosis not present

## 2017-08-23 DIAGNOSIS — F419 Anxiety disorder, unspecified: Secondary | ICD-10-CM | POA: Diagnosis not present

## 2017-08-23 DIAGNOSIS — Z79899 Other long term (current) drug therapy: Secondary | ICD-10-CM | POA: Insufficient documentation

## 2017-08-23 DIAGNOSIS — E7800 Pure hypercholesterolemia, unspecified: Secondary | ICD-10-CM | POA: Diagnosis not present

## 2017-08-23 DIAGNOSIS — K219 Gastro-esophageal reflux disease without esophagitis: Secondary | ICD-10-CM | POA: Diagnosis not present

## 2017-08-23 DIAGNOSIS — J449 Chronic obstructive pulmonary disease, unspecified: Secondary | ICD-10-CM | POA: Insufficient documentation

## 2017-08-23 HISTORY — PX: CHOLECYSTECTOMY: SHX55

## 2017-08-23 SURGERY — LAPAROSCOPIC CHOLECYSTECTOMY
Anesthesia: General | Site: Abdomen

## 2017-08-23 MED ORDER — HEMOSTATIC AGENTS (NO CHARGE) OPTIME
TOPICAL | Status: DC | PRN
  Administered 2017-08-23: 1 via TOPICAL

## 2017-08-23 MED ORDER — OXYCODONE-ACETAMINOPHEN 5-325 MG PO TABS: 1.0000 | ORAL_TABLET | ORAL | 0 refills | Status: DC | PRN

## 2017-08-23 MED ORDER — BUPIVACAINE HCL (PF) 0.5 % IJ SOLN
INTRAMUSCULAR | Status: DC | PRN
  Administered 2017-08-23: 10 mL

## 2017-08-23 MED ORDER — GLYCOPYRROLATE 0.2 MG/ML IJ SOLN
INTRAMUSCULAR | Status: DC | PRN
  Administered 2017-08-23: .6 mg via INTRAVENOUS

## 2017-08-23 MED ORDER — SODIUM CHLORIDE 0.9 % IJ SOLN
INTRAMUSCULAR | Status: AC
  Filled 2017-08-23: qty 20

## 2017-08-23 MED ORDER — ACETAMINOPHEN 325 MG PO TABS
650.0000 mg | ORAL_TABLET | Freq: Four times a day (QID) | ORAL | Status: DC | PRN
  Administered 2017-08-23: 650 mg via ORAL
  Filled 2017-08-23: qty 2

## 2017-08-23 MED ORDER — FENTANYL CITRATE (PF) 250 MCG/5ML IJ SOLN
INTRAMUSCULAR | Status: AC
  Filled 2017-08-23: qty 5

## 2017-08-23 MED ORDER — PROPOFOL 10 MG/ML IV BOLUS
INTRAVENOUS | Status: DC | PRN
  Administered 2017-08-23: 140 mg via INTRAVENOUS

## 2017-08-23 MED ORDER — CHLORHEXIDINE GLUCONATE CLOTH 2 % EX PADS: 6.0000 | MEDICATED_PAD | Freq: Once | CUTANEOUS | Status: DC

## 2017-08-23 MED ORDER — SODIUM CHLORIDE 0.9 % IR SOLN
Status: DC | PRN
  Administered 2017-08-23: 1000 mL

## 2017-08-23 MED ORDER — NEOSTIGMINE METHYLSULFATE 10 MG/10ML IV SOLN
INTRAVENOUS | Status: DC | PRN
  Administered 2017-08-23: 3 mg via INTRAVENOUS

## 2017-08-23 MED ORDER — BUPIVACAINE HCL (PF) 0.5 % IJ SOLN
INTRAMUSCULAR | Status: AC
  Filled 2017-08-23: qty 30

## 2017-08-23 MED ORDER — EPHEDRINE SULFATE 50 MG/ML IJ SOLN
INTRAMUSCULAR | Status: DC | PRN
  Administered 2017-08-23: 10 mg via INTRAVENOUS

## 2017-08-23 MED ORDER — NEOSTIGMINE METHYLSULFATE 10 MG/10ML IV SOLN
INTRAVENOUS | Status: AC
  Filled 2017-08-23: qty 1

## 2017-08-23 MED ORDER — GLYCOPYRROLATE 0.2 MG/ML IJ SOLN
INTRAMUSCULAR | Status: AC
  Filled 2017-08-23: qty 3

## 2017-08-23 MED ORDER — CIPROFLOXACIN IN D5W 400 MG/200ML IV SOLN
400.0000 mg | INTRAVENOUS | Status: AC
  Administered 2017-08-23: 400 mg via INTRAVENOUS
  Filled 2017-08-23: qty 200

## 2017-08-23 MED ORDER — SUCCINYLCHOLINE CHLORIDE 20 MG/ML IJ SOLN
INTRAMUSCULAR | Status: AC
  Filled 2017-08-23: qty 1

## 2017-08-23 MED ORDER — ONDANSETRON 4 MG PO TBDP
4.0000 mg | ORAL_TABLET | Freq: Once | ORAL | Status: AC
  Administered 2017-08-23: 4 mg via ORAL
  Filled 2017-08-23: qty 1

## 2017-08-23 MED ORDER — MIDAZOLAM HCL 2 MG/2ML IJ SOLN
1.0000 mg | Freq: Once | INTRAMUSCULAR | Status: AC | PRN
  Administered 2017-08-23: 2 mg via INTRAVENOUS
  Filled 2017-08-23: qty 2

## 2017-08-23 MED ORDER — LACTATED RINGERS IV SOLN
INTRAVENOUS | Status: DC
  Administered 2017-08-23: 11:00:00 via INTRAVENOUS
  Administered 2017-08-23: 1000 mL via INTRAVENOUS

## 2017-08-23 MED ORDER — FENTANYL CITRATE (PF) 100 MCG/2ML IJ SOLN
INTRAMUSCULAR | Status: DC | PRN
  Administered 2017-08-23: 25 ug via INTRAVENOUS
  Administered 2017-08-23 (×3): 50 ug via INTRAVENOUS

## 2017-08-23 MED ORDER — SUCCINYLCHOLINE 20MG/ML (10ML) SYRINGE FOR MEDFUSION PUMP - OPTIME
INTRAMUSCULAR | Status: DC | PRN
  Administered 2017-08-23: 120 mg via INTRAVENOUS

## 2017-08-23 MED ORDER — LIDOCAINE HCL (PF) 1 % IJ SOLN
INTRAMUSCULAR | Status: AC
  Filled 2017-08-23: qty 10

## 2017-08-23 MED ORDER — ROCURONIUM 10MG/ML (10ML) SYRINGE FOR MEDFUSION PUMP - OPTIME
INTRAVENOUS | Status: DC | PRN
  Administered 2017-08-23: 22 mg via INTRAVENOUS
  Administered 2017-08-23: 8 mg via INTRAVENOUS

## 2017-08-23 MED ORDER — EPHEDRINE SULFATE 50 MG/ML IJ SOLN
INTRAMUSCULAR | Status: AC
  Filled 2017-08-23: qty 2

## 2017-08-23 MED ORDER — LIDOCAINE HCL (CARDIAC) 10 MG/ML IV SOLN
INTRAVENOUS | Status: DC | PRN
  Administered 2017-08-23: 40 mg via INTRAVENOUS

## 2017-08-23 MED ORDER — POVIDONE-IODINE 10 % OINT PACKET
TOPICAL_OINTMENT | CUTANEOUS | Status: DC | PRN
  Administered 2017-08-23: 1 via TOPICAL

## 2017-08-23 MED ORDER — ROCURONIUM BROMIDE 50 MG/5ML IV SOLN
INTRAVENOUS | Status: AC
  Filled 2017-08-23: qty 1

## 2017-08-23 SURGICAL SUPPLY — 45 items
APPLIER CLIP ROT 10 11.4 M/L (STAPLE) ×3
BAG HAMPER (MISCELLANEOUS) ×3 IMPLANT
BAG RETRIEVAL 10 (BASKET) ×1
BAG RETRIEVAL 10MM (BASKET) ×1
CHLORAPREP W/TINT 26ML (MISCELLANEOUS) ×3 IMPLANT
CLIP APPLIE ROT 10 11.4 M/L (STAPLE) ×1 IMPLANT
CLOTH BEACON ORANGE TIMEOUT ST (SAFETY) ×3 IMPLANT
COVER LIGHT HANDLE STERIS (MISCELLANEOUS) ×6 IMPLANT
DECANTER SPIKE VIAL GLASS SM (MISCELLANEOUS) ×3 IMPLANT
ELECT REM PT RETURN 9FT ADLT (ELECTROSURGICAL) ×3
ELECTRODE REM PT RTRN 9FT ADLT (ELECTROSURGICAL) ×1 IMPLANT
FILTER SMOKE EVAC LAPAROSHD (FILTER) ×3 IMPLANT
FORMALIN 10 PREFIL 120ML (MISCELLANEOUS) ×3 IMPLANT
GLOVE BIOGEL PI IND STRL 7.0 (GLOVE) ×1 IMPLANT
GLOVE BIOGEL PI INDICATOR 7.0 (GLOVE) ×2
GLOVE SURG SS PI 7.5 STRL IVOR (GLOVE) ×3 IMPLANT
GOWN STRL REUS W/ TWL XL LVL3 (GOWN DISPOSABLE) ×1 IMPLANT
GOWN STRL REUS W/TWL LRG LVL3 (GOWN DISPOSABLE) ×6 IMPLANT
GOWN STRL REUS W/TWL XL LVL3 (GOWN DISPOSABLE) ×2
HEMOSTAT SNOW SURGICEL 2X4 (HEMOSTASIS) ×3 IMPLANT
INST SET LAPROSCOPIC AP (KITS) ×3 IMPLANT
IV NS IRRIG 3000ML ARTHROMATIC (IV SOLUTION) IMPLANT
KIT ROOM TURNOVER APOR (KITS) ×3 IMPLANT
MANIFOLD NEPTUNE II (INSTRUMENTS) ×3 IMPLANT
NEEDLE INSUFFLATION 14GA 120MM (NEEDLE) ×3 IMPLANT
NS IRRIG 1000ML POUR BTL (IV SOLUTION) ×3 IMPLANT
PACK LAP CHOLE LZT030E (CUSTOM PROCEDURE TRAY) ×3 IMPLANT
PAD ARMBOARD 7.5X6 YLW CONV (MISCELLANEOUS) ×3 IMPLANT
SET BASIN LINEN APH (SET/KITS/TRAYS/PACK) ×3 IMPLANT
SET TUBE IRRIG SUCTION NO TIP (IRRIGATION / IRRIGATOR) IMPLANT
SLEEVE ENDOPATH XCEL 5M (ENDOMECHANICALS) ×3 IMPLANT
SPONGE GAUZE 2X2 8PLY STER LF (GAUZE/BANDAGES/DRESSINGS) ×1
SPONGE GAUZE 2X2 8PLY STRL LF (GAUZE/BANDAGES/DRESSINGS) ×2 IMPLANT
STAPLER VISISTAT (STAPLE) ×3 IMPLANT
SUT VICRYL 0 UR6 27IN ABS (SUTURE) ×3 IMPLANT
SYS BAG RETRIEVAL 10MM (BASKET) ×1
SYSTEM BAG RETRIEVAL 10MM (BASKET) ×1 IMPLANT
TAPE PAPER 3X10 WHT MICROPORE (GAUZE/BANDAGES/DRESSINGS) ×3 IMPLANT
TROCAR ENDO BLADELESS 11MM (ENDOMECHANICALS) ×3 IMPLANT
TROCAR XCEL NON-BLD 5MMX100MML (ENDOMECHANICALS) ×3 IMPLANT
TROCAR XCEL UNIV SLVE 11M 100M (ENDOMECHANICALS) ×3 IMPLANT
TUBE CONNECTING 12'X1/4 (SUCTIONS) ×1
TUBE CONNECTING 12X1/4 (SUCTIONS) ×2 IMPLANT
TUBING INSUFFLATION (TUBING) ×3 IMPLANT
WARMER LAPAROSCOPE (MISCELLANEOUS) ×3 IMPLANT

## 2017-08-23 NOTE — Anesthesia Postprocedure Evaluation (Signed)
Anesthesia Post Note  Patient: Francisco Butler  Procedure(s) Performed: Procedure(s) (LRB): LAPAROSCOPIC CHOLECYSTECTOMY (N/A)  Patient location during evaluation: PACU Anesthesia Type: General Level of consciousness: awake and alert and oriented Pain management: pain level controlled Vital Signs Assessment: post-procedure vital signs reviewed and stable Respiratory status: spontaneous breathing Cardiovascular status: blood pressure returned to baseline and stable Postop Assessment: no apparent nausea or vomiting Anesthetic complications: no     Last Vitals:  Vitals:   08/23/17 1230 08/23/17 1245  BP: 122/70 (!) 124/58  Pulse: 65 65  Resp: (!) 8 15  Temp:    SpO2: 97% 96%    Last Pain:  Vitals:   08/23/17 1230  TempSrc:   PainSc: Asleep                 Degan Hanser

## 2017-08-23 NOTE — Transfer of Care (Signed)
Immediate Anesthesia Transfer of Care Note  Patient: Francisco Butler  Procedure(s) Performed: Procedure(s): LAPAROSCOPIC CHOLECYSTECTOMY (N/A)  Patient Location: PACU  Anesthesia Type:General  Level of Consciousness: awake and alert   Airway & Oxygen Therapy: Patient Spontanous Breathing and Patient connected to face mask oxygen  Post-op Assessment: Report given to RN  Post vital signs: Reviewed and stable  Last Vitals:  Vitals:   08/23/17 1015 08/23/17 1030  BP: 124/72 128/76  Resp:  (!) 56  Temp:    SpO2: 94% 94%    Last Pain:  Vitals:   08/23/17 0927  TempSrc: Oral      Patients Stated Pain Goal: 7 (47/82/95 [id number])  Complications: No apparent anesthesia complications

## 2017-08-23 NOTE — Op Note (Signed)
Patient:  Francisco Butler  DOB:  1946-08-18  MRN:  [id number]   Preop Diagnosis:  Chronic cholecystitis  Postop Diagnosis:  Same  Procedure:  Laparoscopic cholecystectomy  Surgeon:  Aviva Signs, M.D.  Asst.: Curlene Labrum, M.D.  Anes:  Gen. endotracheal  Indications:  Patient is a 71 year old white male who presents with biliary colic secondary to chronic cholecystitis. The risks and benefits of the procedure including bleeding, infection, hepatobiliary injury, and the possibility of an open procedure were fully explained to the patient, who gave informed consent.  Procedure note:  The patient was placed in supine position. After induction of general endotracheal anesthesia, the abdomen was prepped and draped using the usual sterile technique with DuraPrep. Surgical site confirmation was performed.  A supraumbilical incision was made down to the fascia. A Veress needle was introduced into the abdominal cavity and confirmation of placement was done using the saline drop test. The abdomen was then insufflated to 16 mmHg pressure. An 11 mm trocar was introduced into the abdominal cavity under direct visualization without difficulty. The patient was placed in reverse Trendelenburg position and an additional 11 mm trocar was placed the epigastric region and 5 mm trochars were placed the right upper quadrant and right flank regions. The liver was inspected and noted to be within normal limits. The gallbladder was retracted in a dynamic fashion in order to provide a critical view of the triangle of Calot. The cystic duct was first identified. Its juncture to the infundibulum was fully identified. Endoclips placed proximally and distally on cystic duct, and the cystic duct was divided. This was likewise done to the cystic artery. The gallbladder was freed away from the gallbladder fossa using Bovie electrocautery. The gallbladder was delivered through the epigastric trocar site using an Endo Catch  bag. The gallbladder fossa was inspected and no abnormal bleeding or bile leakage was noted. Surgicel was placed the gallbladder fossa. All fluid and air were then evacuated from the abdominal cavity prior to the removal of the trochars.  All wounds were irrigated with normal saline. All wounds were injected with 0.5% Sensorcaine. The supraumbilical fascia was reapproximated using an 0 Vicryl interrupted suture. All skin incisions were closed using staples. Betadine ointment and dry sterile dressings were applied.  All tape and needle counts were correct at the end of procedure. The patient was extubated in the operating room and transferred to PACU in stable condition.  Complications:  None  EBL:  Minimal  Specimen:  Gallbladder

## 2017-08-23 NOTE — Anesthesia Procedure Notes (Signed)
Procedure Name: Intubation Date/Time: 08/23/2017 10:58 AM Performed by: Tressie Stalker E Pre-anesthesia Checklist: Patient identified, Patient being monitored, Timeout performed, Emergency Drugs available and Suction available Patient Re-evaluated:Patient Re-evaluated prior to induction Oxygen Delivery Method: Circle system utilized Preoxygenation: Pre-oxygenation with 100% oxygen Induction Type: IV induction Ventilation: Mask ventilation without difficulty Laryngoscope Size: Mac and 3 Grade View: Grade I Tube type: Oral Tube size: 7.0 mm Number of attempts: 1 Airway Equipment and Method: Stylet Placement Confirmation: ETT inserted through vocal cords under direct vision,  positive ETCO2 and breath sounds checked- equal and bilateral Secured at: 22 cm Tube secured with: Tape Dental Injury: Teeth and Oropharynx as per pre-operative assessment

## 2017-08-23 NOTE — Anesthesia Preprocedure Evaluation (Addendum)
Anesthesia Evaluation  Patient identified by MRN, date of birth, ID band Patient awake    Reviewed: Allergy & Precautions, NPO status , Patient's Chart, lab work & pertinent test results  Airway Mallampati: I  TM Distance: >3 FB Neck ROM: Full    Dental  (+) Edentulous Upper, Edentulous Lower   Pulmonary COPD,  COPD inhaler, former smoker,    Pulmonary exam normal        Cardiovascular Exercise Tolerance: Good hypertension, Pt. on medications  Rhythm:Regular Rate:Normal  Normal sinus rhythm Normal ECG early repolarization  (2018)   Neuro/Psych    GI/Hepatic hiatal hernia, GERD  Medicated and Controlled,  Endo/Other  Hypothyroidism   Renal/GU      Musculoskeletal  (+) Arthritis ,   Abdominal Normal abdominal exam  (+)   Peds  Hematology  (+) anemia , Results for TRUMAINE, WIMER (MRN [id number]) as of 08/23/2017 08:51  08/07/2017 14:39 Hemoglobin: 12.5 (L) HCT: 37.2 (L)    Anesthesia Other Findings   Reproductive/Obstetrics                           Anesthesia Physical Anesthesia Plan  ASA: III  Anesthesia Plan: General   Post-op Pain Management:    Induction: Intravenous  PONV Risk Score and Plan: Ondansetron, Midazolam and Dexamethasone  Airway Management Planned: Oral ETT  Additional Equipment:   Intra-op Plan:   Post-operative Plan: Extubation in OR  Informed Consent: I have reviewed the patients History and Physical, chart, labs and discussed the procedure including the risks, benefits and alternatives for the proposed anesthesia with the patient or authorized representative who has indicated his/her understanding and acceptance.   Dental advisory given  Plan Discussed with:   Anesthesia Plan Comments:         Anesthesia Quick Evaluation

## 2017-08-23 NOTE — Discharge Instructions (Signed)
Laparoscopic Cholecystectomy, Care After °This sheet gives you information about how to care for yourself after your procedure. Your health care provider may also give you more specific instructions. If you have problems or questions, contact your health care provider. °What can I expect after the procedure? °After the procedure, it is common to have: °· Pain at your incision sites. You will be given medicines to control this pain. °· Mild nausea or vomiting. °· Bloating and possible shoulder pain from the air-like gas that was used during the procedure. °Follow these instructions at home: °Incision care  ° °· Follow instructions from your health care provider about how to take care of your incisions. Make sure you: °¨ Wash your hands with soap and water before you change your bandage (dressing). If soap and water are not available, use hand sanitizer. °¨ Change your dressing as told by your health care provider. °¨ Leave stitches (sutures), skin glue, or adhesive strips in place. These skin closures may need to be in place for 2 weeks or longer. If adhesive strip edges start to loosen and curl up, you may trim the loose edges. Do not remove adhesive strips completely unless your health care provider tells you to do that. °· Do not take baths, swim, or use a hot tub until your health care provider approves. Ask your health care provider if you can take showers. You may only be allowed to take sponge baths for bathing. °· Check your incision area every day for signs of infection. Check for: °¨ More redness, swelling, or pain. °¨ More fluid or blood. °¨ Warmth. °¨ Pus or a bad smell. °Activity  °· Do not drive or use heavy machinery while taking prescription pain medicine. °· Do not lift anything that is heavier than 10 lb (4.5 kg) until your health care provider approves. °· Do not play contact sports until your health care provider approves. °· Do not drive for 24 hours if you were given a medicine to help you relax  (sedative). °· Rest as needed. Do not return to work or school until your health care provider approves. °General instructions  °· Take over-the-counter and prescription medicines only as told by your health care provider. °· To prevent or treat constipation while you are taking prescription pain medicine, your health care provider may recommend that you: °¨ Drink enough fluid to keep your urine clear or pale yellow. °¨ Take over-the-counter or prescription medicines. °¨ Eat foods that are high in fiber, such as fresh fruits and vegetables, whole grains, and beans. °¨ Limit foods that are high in fat and processed sugars, such as fried and sweet foods. °Contact a health care provider if: °· You develop a rash. °· You have more redness, swelling, or pain around your incisions. °· You have more fluid or blood coming from your incisions. °· Your incisions feel warm to the touch. °· You have pus or a bad smell coming from your incisions. °· You have a fever. °· One or more of your incisions breaks open. °Get help right away if: °· You have trouble breathing. °· You have chest pain. °· You have increasing pain in your shoulders. °· You faint or feel dizzy when you stand. °· You have severe pain in your abdomen. °· You have nausea or vomiting that lasts for more than one day. °· You have leg pain. °This information is not intended to replace advice given to you by your health care provider. Make sure you discuss any   questions you have with your health care provider. °Document Released: 11/19/2005 Document Revised: 06/09/2016 Document Reviewed: 05/07/2016 °Elsevier Interactive Patient Education © 2017 [employer] ° °

## 2017-08-23 NOTE — Interval H&P Note (Signed)
History and Physical Interval Note:  08/23/2017 10:35 AM  Francisco Butler  has presented today for surgery, with the diagnosis of chronic cholecystitis  The various methods of treatment have been discussed with the patient and family. After consideration of risks, benefits and other options for treatment, the patient has consented to  Procedure(s): LAPAROSCOPIC CHOLECYSTECTOMY (N/A) as a surgical intervention .  The patient's history has been reviewed, patient examined, no change in status, stable for surgery.  I have reviewed the patient's chart and labs.  Questions were answered to the patient's satisfaction.     Aviva Signs

## 2017-08-26 ENCOUNTER — Encounter (HOSPITAL_COMMUNITY): Payer: Self-pay | Admitting: General Surgery

## 2017-08-29 ENCOUNTER — Encounter (INDEPENDENT_AMBULATORY_CARE_PROVIDER_SITE_OTHER): Payer: Self-pay | Admitting: *Deleted

## 2017-08-29 ENCOUNTER — Other Ambulatory Visit (INDEPENDENT_AMBULATORY_CARE_PROVIDER_SITE_OTHER): Payer: Self-pay | Admitting: *Deleted

## 2017-08-29 DIAGNOSIS — R1013 Epigastric pain: Secondary | ICD-10-CM

## 2017-09-03 ENCOUNTER — Ambulatory Visit (INDEPENDENT_AMBULATORY_CARE_PROVIDER_SITE_OTHER): Payer: Self-pay | Admitting: General Surgery

## 2017-09-03 ENCOUNTER — Encounter: Payer: Self-pay | Admitting: General Surgery

## 2017-09-03 VITALS — BP 127/71 | HR 79 | Temp 98.7°F | Resp 18 | Ht 70.0 in | Wt 194.0 lb

## 2017-09-03 DIAGNOSIS — Z09 Encounter for follow-up examination after completed treatment for conditions other than malignant neoplasm: Secondary | ICD-10-CM

## 2017-09-03 NOTE — Progress Notes (Signed)
Subjective:     Francisco Butler  Status post laparoscopic cholecystectomy. Doing very well. Has no complaints. Objective:    BP 127/71   Pulse 79   Temp 98.7 F (37.1 C)   Resp 18   Ht 5\' 10"  (1.778 m)   Wt 194 lb (88 kg)   BMI 27.84 kg/m   General:  alert, cooperative and no distress  Abdomen soft, incisions healing well. Staples removed, Steri-Strips applied. Final pathology consistent with diagnosis.     Assessment:    Doing well postoperatively.    Plan:   Increase activity as able. Follow-up here as needed.

## 2018-05-29 ENCOUNTER — Encounter (INDEPENDENT_AMBULATORY_CARE_PROVIDER_SITE_OTHER): Payer: Self-pay | Admitting: Internal Medicine

## 2018-05-29 ENCOUNTER — Encounter (INDEPENDENT_AMBULATORY_CARE_PROVIDER_SITE_OTHER): Payer: Self-pay | Admitting: *Deleted

## 2018-05-29 ENCOUNTER — Ambulatory Visit (INDEPENDENT_AMBULATORY_CARE_PROVIDER_SITE_OTHER): Payer: Medicare Other | Admitting: Internal Medicine

## 2018-05-29 VITALS — BP 152/82 | HR 64 | Temp 97.5°F | Ht 70.0 in | Wt 198.0 lb

## 2018-05-29 DIAGNOSIS — R1310 Dysphagia, unspecified: Secondary | ICD-10-CM | POA: Diagnosis not present

## 2018-05-29 DIAGNOSIS — R1319 Other dysphagia: Secondary | ICD-10-CM

## 2018-05-29 NOTE — Patient Instructions (Signed)
DG esphagram.   

## 2018-05-29 NOTE — Progress Notes (Signed)
Subjective:    Patient ID: Francisco Butler, male    DOB: 1946/02/10, 72 y.o.   MRN: [id number] PCP Marnee Guarneri FNP  HPI Here today for f/u. Last seen in September of 2018 with epigastric pain. Underwent a HIDA scan which revealed a non-functioning gallbladder. Surgery was by Dr. Arnoldo Morale for a laparoscopic cholecystectomy. Last EGD in 2013 for follow up of Gastric ulcer which was normal. He tells me he is doing pretty good. The other day, he ordered a cheese burger. He took one bite and his throat shut down. He did the Hemlick and removed the bolus.  He says he has had an EGD/ED in the past. GERD controlled with Omeprazole.  He is careful what he eats. He says the dust from the cereal sticks in his throat.  His appetite is not good. He has gained 4 pounds since his last visit in September of 2018.  BMs are okay. No melena or BRRB.   His last colonoscopy was in 08/2013 for hx of colon polyps Two rt colon polyps, one is a small polyp 5 mm mid right colon removed by snare and cautery and a large almost 1 cm sessile polyp in the upper rt colon removed.  Rest of colon normal except for some diverticulosis left colon.  Biopsy: Hyperplastic polyps.  11/15/2011 EGD/ED: GERD, dysphagia Normal upper esophagus. Lower esophageal ring.  Stricture dilated Venia Minks #54 and # 56   08/04/2017 [place] RUQ: epigastric pain Normal.  CBC 5.1 mm.   12/  Review of Systems Past Medical History:  Diagnosis Date  . Abdominal pain, chronic, epigastric 08/07/2017  . Anxiety   . Arthritis   . COPD (chronic obstructive pulmonary disease) ([place])   . GERD (gastroesophageal reflux disease)   . H/O hiatal hernia   . High cholesterol   . Hypertension   . Hypothyroidism   . Seasonal allergies     Past Surgical History:  Procedure Laterality Date  . ANTERIOR CERVICAL DECOMP/DISCECTOMY FUSION N/A 01/05/2014   Procedure: Cervical five-six, Cervical six-seven, Cervical seven-thoracic one anterior cervical decompression with  fusion interbody prosthesis plating and bonegraft;  Surgeon: Erline Levine, MD;  Location: [place];  Service: Neurosurgery;  Laterality: N/A;  Cervical five-six, Cervical six-seven, Cervical seven-thoracic one anterior cervical decompression with fusion interbody prosthesis plating  . CHOLECYSTECTOMY N/A 08/23/2017   Procedure: LAPAROSCOPIC CHOLECYSTECTOMY;  Surgeon: Aviva Signs, MD;  Location: [facility];  Service: General;  Laterality: N/A;  . EYE SURGERY Bilateral    lazy eye surgery  . FRACTURE SURGERY Right    foot  . FRACTURE SURGERY Right    collar bone  . HERNIA REPAIR     umbilical hernia repair  . KNEE SURGERY    . SHOULDER ARTHROSCOPY Right   . TONSILLECTOMY      Allergies  Allergen Reactions  . Biaxin [Clarithromycin] Other (See Comments)    hiccups   . Meloxicam Other (See Comments)    ulcers    Current Outpatient Medications on File Prior to Visit  Medication Sig Dispense Refill  . cyclobenzaprine (FLEXERIL) 10 MG tablet Take 10 mg by mouth daily as needed for muscle spasms.    . diazepam (VALIUM) 5 MG tablet Take 1 tablet (5 mg total) by mouth every 6 (six) hours as needed for muscle spasms. (Patient taking differently: Take 5 mg by mouth every 6 (six) hours as needed for muscle spasms (for claustrophobia with travel). ) 60 tablet 0  . Levothyroxine Sodium 137 MCG CAPS  Take 137 mcg by mouth daily before breakfast.     . lisinopril-hydrochlorothiazide (PRINZIDE,ZESTORETIC) 20-12.5 MG per tablet Take 1 tablet by mouth daily.    . montelukast (SINGULAIR) 10 MG tablet Take 10 mg by mouth daily with breakfast.    . omeprazole (PRILOSEC) 20 MG capsule Take 40 mg by mouth daily before breakfast.     . oxyCODONE-acetaminophen (PERCOCET/ROXICET) 5-325 MG tablet Take 1 tablet by mouth every 4 (four) hours as needed for moderate pain. 40 tablet 0  . simvastatin (ZOCOR) 20 MG tablet Take 20 mg by mouth daily.     No current facility-administered medications on file prior to  visit.         Objective:   Physical Exam Blood pressure (!) 152/82, pulse 64, temperature (!) 97.5 F (36.4 C), height 5\' 10"  (1.778 m), weight 198 lb (89.8 kg). Alert and oriented. Skin warm and dry. Oral mucosa is moist.   . Sclera anicteric, conjunctivae is pink. Thyroid not enlarged. No cervical lymphadenopathy. Lungs clear. Heart regular rate and rhythm.  Abdomen is soft. Bowel sounds are positive. No hepatomegaly. No abdominal masses felt. No tenderness.  No edema to lower extremities.           Assessment & Plan:  Dysphagia, ? Choking. Am going to get a DG Esophagram to see if he has a stricture. Continue the Omeprazole.  Further reccomendations to follow.

## 2018-06-03 ENCOUNTER — Ambulatory Visit (HOSPITAL_COMMUNITY)
Admission: RE | Admit: 2018-06-03 | Discharge: 2018-06-03 | Disposition: A | Discharge status: Home / Self Care | Payer: Medicare Other | Source: Ambulatory Visit | Attending: Internal Medicine | Admitting: Internal Medicine

## 2018-06-03 DIAGNOSIS — K449 Diaphragmatic hernia without obstruction or gangrene: Secondary | ICD-10-CM | POA: Diagnosis not present

## 2018-06-03 DIAGNOSIS — K219 Gastro-esophageal reflux disease without esophagitis: Secondary | ICD-10-CM | POA: Insufficient documentation

## 2018-06-03 DIAGNOSIS — R1319 Other dysphagia: Secondary | ICD-10-CM

## 2018-06-03 DIAGNOSIS — K224 Dyskinesia of esophagus: Secondary | ICD-10-CM | POA: Insufficient documentation

## 2018-06-03 DIAGNOSIS — R1310 Dysphagia, unspecified: Secondary | ICD-10-CM | POA: Diagnosis present

## 2018-09-17 ENCOUNTER — Ambulatory Visit (INDEPENDENT_AMBULATORY_CARE_PROVIDER_SITE_OTHER): Payer: Medicare Other | Admitting: Orthopaedic Surgery

## 2018-10-05 IMAGING — RF DG ESOPHAGUS
9 of 13 series · 14 of 24 positions shown · non-contrast
Comparison: None.

CLINICAL DATA: 71-year-old male with history of dysphagia.
Sensation of food getting stuck in the throat.

EXAM:
ESOPHOGRAM / BARIUM SWALLOW / BARIUM TABLET STUDY
TECHNIQUE: Combined double contrast and single contrast examination performed
using effervescent crystals, thick barium liquid, and thin barium
liquid. The patient was observed with fluoroscopy swallowing a 13 mm
barium sulphate tablet.
FLUOROSCOPY TIME:  Fluoroscopy Time:  1 minutes and 42 seconds
Radiation Exposure Index (if provided by the fluoroscopic device):
15.7 mGy

[Series 1: fluoro_barium 2fps_bw · 0.17mm/px · 1 of 1 slices shown (1 of 3)]
[im 1/1]
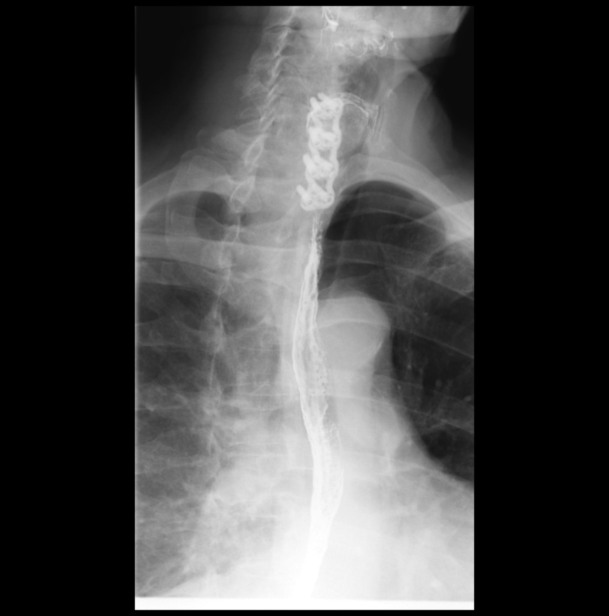

[Series 3: fluoro_barium 2fps_bw · 0.17mm/px · 1 of 1 slices shown (2 of 3)]
[im 1/1]
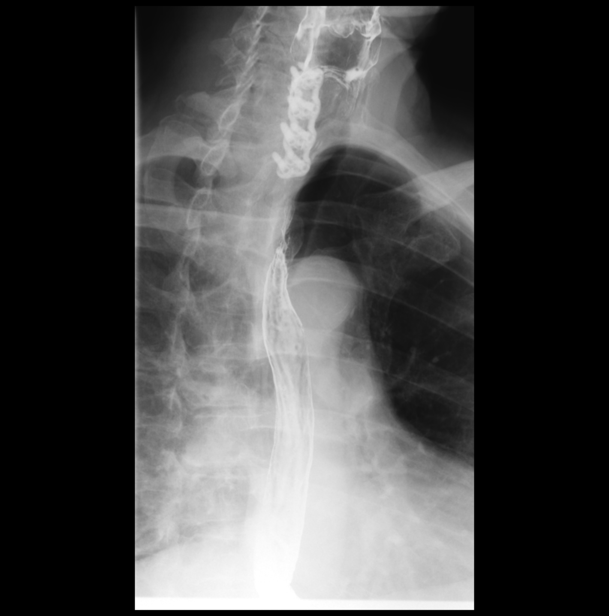

[Series 6: cp_standard · 0.26mm/px · 3 of 17 frames shown (1 of 6)]
[frame 3/17]
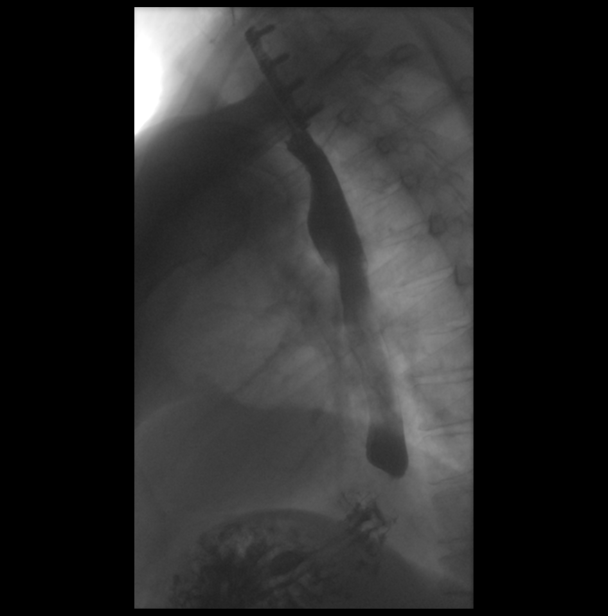
[frame 13/17]
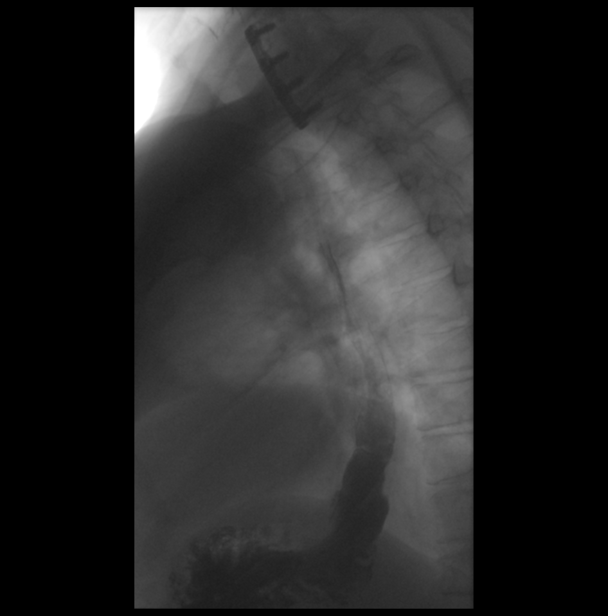
[frame 15/17]
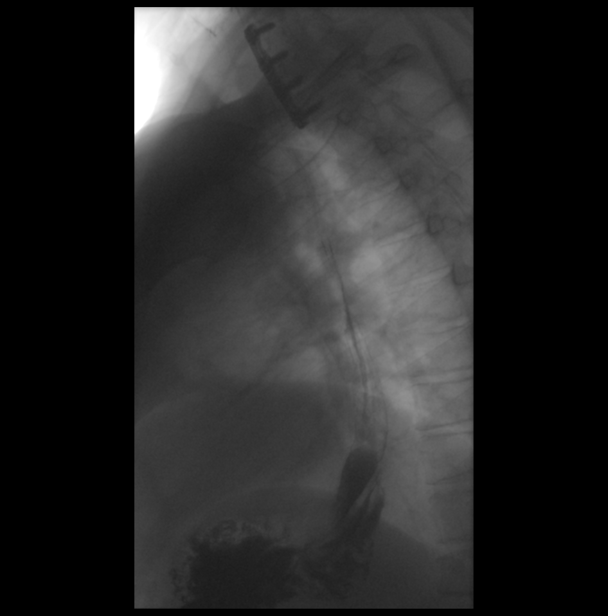

[Series 7: cp_standard · 0.26mm/px · 1 of 22 frames shown (2 of 6)]
[frame 19/22]
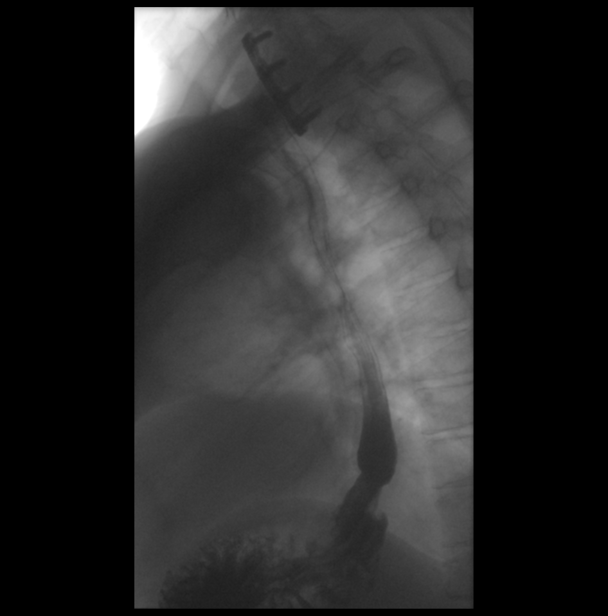

[Series 8: cp_standard · 0.27mm/px · 2 of 51 frames shown (3 of 6)]
[frame 8/51]
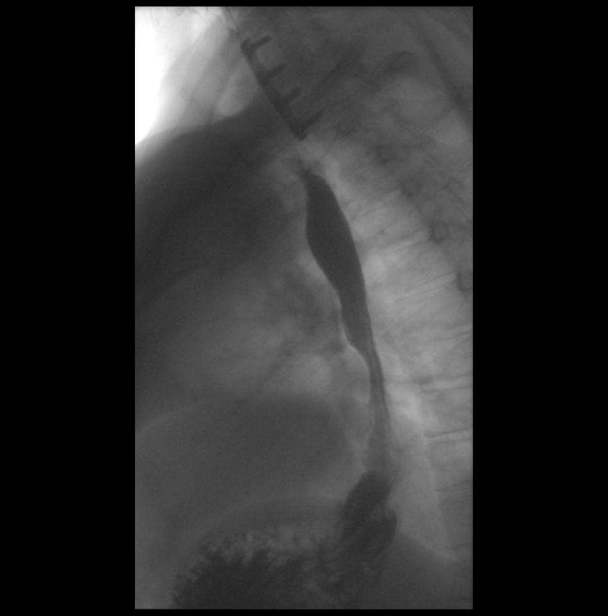
[frame 26/51]
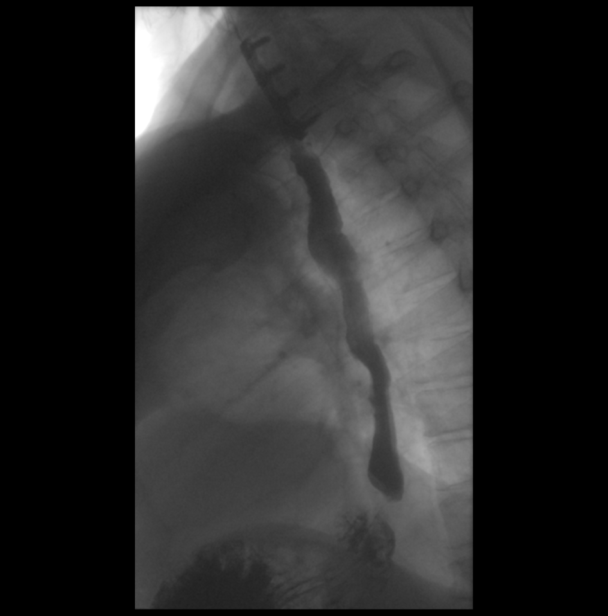

[Series 9: cp_standard · 0.27mm/px · 2 of 35 frames shown (4 of 6)]
[frame 6/35]
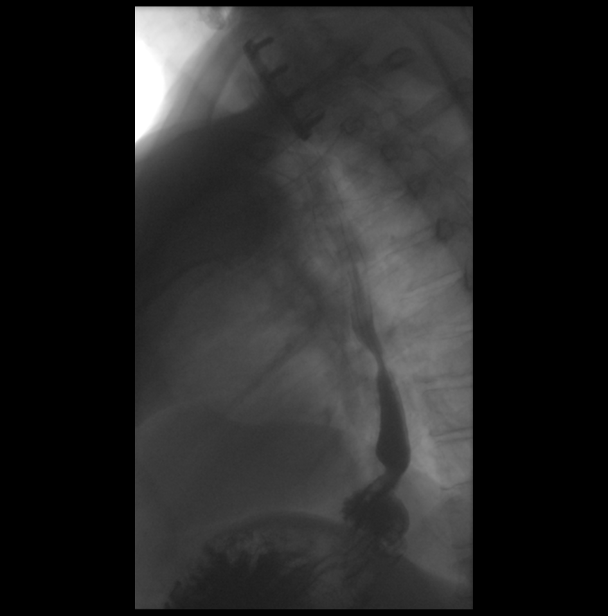
[frame 30/35]
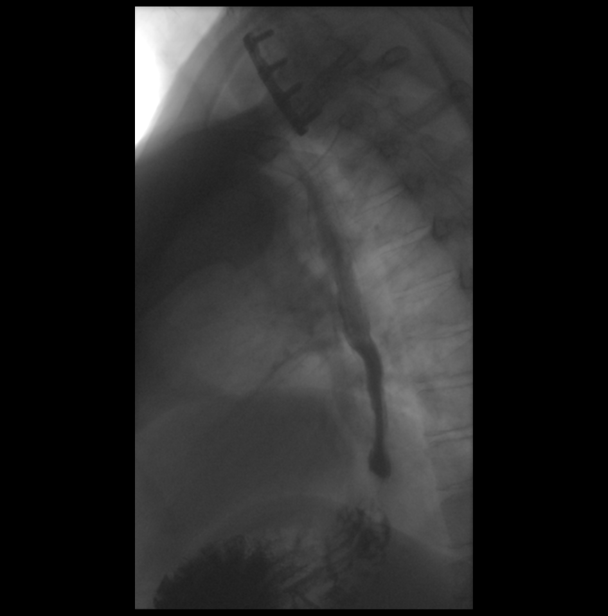

[Series 11: fluoro_barium 2fps_bw · 0.18mm/px · 1 of 1 slices shown (3 of 3)]
[im 1/1]
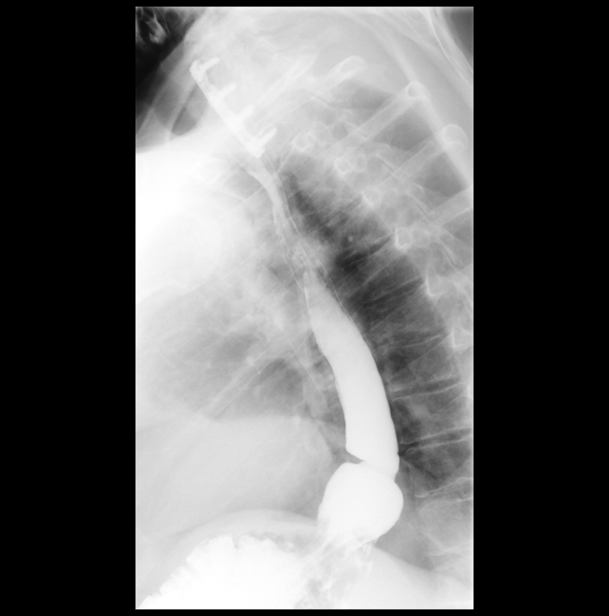

[Series 12: cp_standard · 0.27mm/px · 2 of 52 frames shown (5 of 6)]
[frame 2/52]
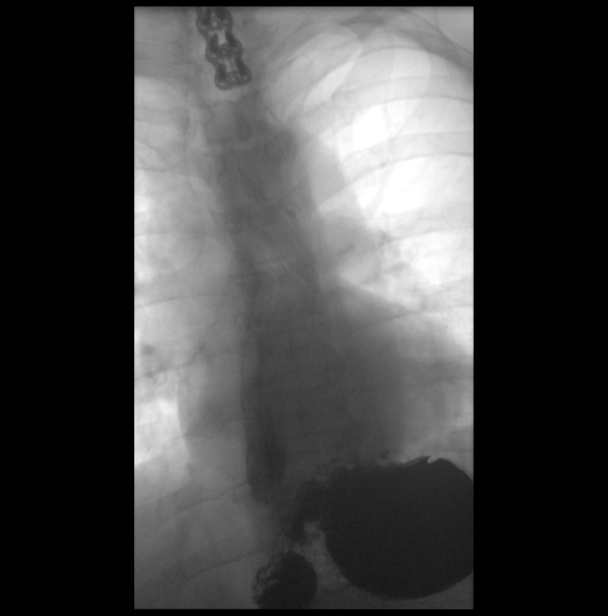
[frame 45/52]
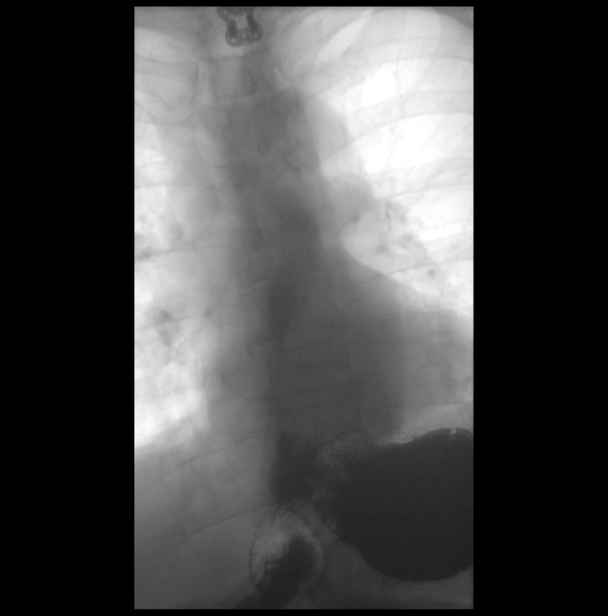

[Series 14: cp_standard · 0.25mm/px · 1 of 1 slices shown (6 of 6)]
[im 1/1]
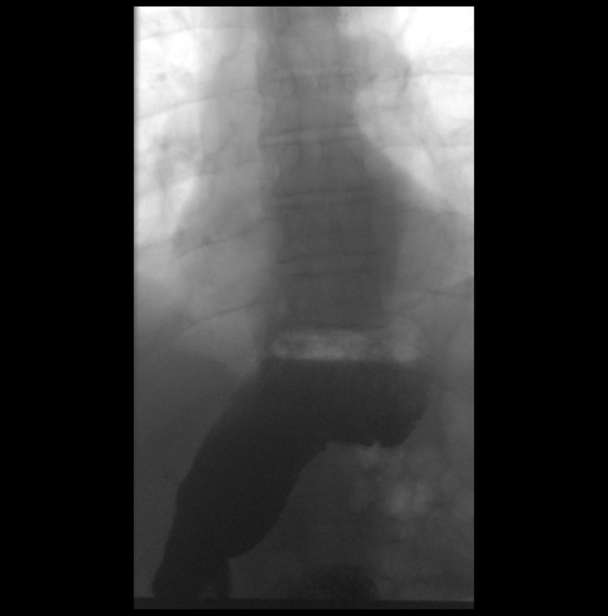

[14 of 24 positions shown; findings below may reference images not displayed]

FINDINGS: Double contrast images demonstrated a normal appearance of the
esophageal mucosa. Multiple single swallow attempts were observed,
which demonstrated generally normal esophageal motility, however, on
some single swallow attempts there is failure to fully propagate the
primary peristaltic wave, with some very mild tertiary contractions.
Full column esophagram demonstrated no esophageal mass, stricture or
esophageal ring. Small hiatal hernia. Water siphon test demonstrated
mild gastroesophageal reflux. A barium tablet was administered,
which passed readily into the stomach.
IMPRESSION: 1. Mild nonspecific esophageal motility disorder.
2. Small hiatal hernia.
3. Mild gastroesophageal reflux.

## 2018-12-16 ENCOUNTER — Encounter (INDEPENDENT_AMBULATORY_CARE_PROVIDER_SITE_OTHER): Payer: Self-pay | Admitting: *Deleted

## 2018-12-16 ENCOUNTER — Ambulatory Visit (INDEPENDENT_AMBULATORY_CARE_PROVIDER_SITE_OTHER): Payer: Medicare Other | Admitting: Internal Medicine

## 2018-12-16 ENCOUNTER — Encounter (INDEPENDENT_AMBULATORY_CARE_PROVIDER_SITE_OTHER): Payer: Self-pay | Admitting: Internal Medicine

## 2018-12-16 VITALS — BP 151/89 | HR 78 | Temp 98.4°F | Ht 70.0 in | Wt 200.1 lb

## 2018-12-16 DIAGNOSIS — R1310 Dysphagia, unspecified: Secondary | ICD-10-CM | POA: Insufficient documentation

## 2018-12-16 DIAGNOSIS — R1319 Other dysphagia: Secondary | ICD-10-CM | POA: Insufficient documentation

## 2018-12-16 NOTE — Patient Instructions (Signed)
The risks of bleeding, perforation and infection were reviewed with patient.  

## 2018-12-16 NOTE — Progress Notes (Signed)
Subjective:    Patient ID: Francisco Butler, male    DOB: 1946/04/20, 73 y.o.   MRN: [id number]  HPI referred by Dr. Rhae Lerner for dysphagia. He tells me he is choking on foods. He says cereal and crackers will lodge. He says pills also bother him. Hamburger lodges. Anything greasy lodges. Hx of same.  His appetite is okay.  Underwent an Esophagram in July of this year which revealed: IMPRESSION: 1. Mild nonspecific esophageal motility disorder. 2. Small hiatal hernia. 3. Mild gastroesophageal reflux.    His last colonoscopy was in 08/2013 for hx of colon polyps Two rt colon polyps, one is a small polyp 5 mm mid right colon removed by snare and cautery and a large almost 1 cm sessile polyp in the upper rt colon removed.  Rest of colon normal except for some diverticulosis left colon.  Biopsy: Hyperplastic polyps.  11/15/2011 EGD/ED: GERD, dysphagia Normal upper esophagus. Lower esophageal ring.  Stricture dilated Maloney #54 and # 44    Review of Systems Past Medical History:  Diagnosis Date  . Abdominal pain, chronic, epigastric 08/07/2017  . Anxiety   . Arthritis   . COPD (chronic obstructive pulmonary disease) ([place])   . GERD (gastroesophageal reflux disease)   . H/O hiatal hernia   . High cholesterol   . Hypertension   . Hypothyroidism   . Seasonal allergies     Past Surgical History:  Procedure Laterality Date  . ANTERIOR CERVICAL DECOMP/DISCECTOMY FUSION N/A 01/05/2014   Procedure: Cervical five-six, Cervical six-seven, Cervical seven-thoracic one anterior cervical decompression with fusion interbody prosthesis plating and bonegraft;  Surgeon: Erline Levine, MD;  Location: [place] [facility];  Service: Neurosurgery;  Laterality: N/A;  Cervical five-six, Cervical six-seven, Cervical seven-thoracic one anterior cervical decompression with fusion interbody prosthesis plating  . CHOLECYSTECTOMY N/A 08/23/2017   Procedure: LAPAROSCOPIC CHOLECYSTECTOMY;  Surgeon: Aviva Signs, MD;   Location: [facility];  Service: General;  Laterality: N/A;  . EYE SURGERY Bilateral    lazy eye surgery  . FRACTURE SURGERY Right    foot  . FRACTURE SURGERY Right    collar bone  . HERNIA REPAIR     umbilical hernia repair  . KNEE SURGERY    . SHOULDER ARTHROSCOPY Right   . TONSILLECTOMY      Allergies  Allergen Reactions  . Biaxin [Clarithromycin] Other (See Comments)    hiccups   . Meloxicam Other (See Comments)    ulcers    Current Outpatient Medications on File Prior to Visit  Medication Sig Dispense Refill  . cyclobenzaprine (FLEXERIL) 10 MG tablet Take 10 mg by mouth daily as needed for muscle spasms.    . diazepam (VALIUM) 5 MG tablet Take 1 tablet (5 mg total) by mouth every 6 (six) hours as needed for muscle spasms. (Patient taking differently: Take 5 mg by mouth every 6 (six) hours as needed for muscle spasms (for claustrophobia with travel). ) 60 tablet 0  . Levothyroxine Sodium 137 MCG CAPS Take 137 mcg by mouth daily before breakfast.     . lisinopril-hydrochlorothiazide (PRINZIDE,ZESTORETIC) 20-12.5 MG per tablet Take 1 tablet by mouth daily.    . montelukast (SINGULAIR) 10 MG tablet Take 10 mg by mouth daily with breakfast.    . NABUMETONE PO Take 740 mg by mouth.    [name] omeprazole (PRILOSEC) 20 MG capsule Take 40 mg by mouth daily before breakfast.     . oxyCODONE-acetaminophen (PERCOCET/ROXICET) 5-325 MG tablet Take 1 tablet by mouth every 4 (  four) hours as needed for moderate pain. 40 tablet 0  . simvastatin (ZOCOR) 20 MG tablet Take 20 mg by mouth daily.     No current facility-administered medications on file prior to visit.         Objective:   Physical Exam Blood pressure (!) 151/89, pulse 78, temperature 98.4 F (36.9 C), height 5\' 10"  (1.778 m), weight 200 lb 1.6 oz (90.8 kg). Alert and oriented. Skin warm and dry. Oral mucosa is moist.   . Sclera anicteric, conjunctivae is pink. Thyroid not enlarged. No cervical lymphadenopathy. Lungs clear. Heart  regular rate and rhythm.  Abdomen is soft. Bowel sounds are positive. No hepatomegaly. No abdominal masses felt. No tenderness.  No edema to lower extremities.         Assessment & Plan:  Dysphagia. The risks of bleeding, perforation and infection were reviewed with patient. EGD/ED. Stricture needs to be ruled out.

## 2019-01-12 ENCOUNTER — Encounter (HOSPITAL_COMMUNITY)
Admission: RE | Disposition: A | Discharge status: Home / Self Care | Payer: Self-pay | Source: Home / Self Care | Attending: Internal Medicine

## 2019-01-12 ENCOUNTER — Other Ambulatory Visit: Payer: Self-pay

## 2019-01-12 ENCOUNTER — Encounter (HOSPITAL_COMMUNITY): Payer: Self-pay | Admitting: *Deleted

## 2019-01-12 ENCOUNTER — Ambulatory Visit (HOSPITAL_COMMUNITY)
Admission: RE | Admit: 2019-01-12 | Discharge: 2019-01-12 | Disposition: A | Discharge status: Home / Self Care | Payer: Medicare Other | Attending: Internal Medicine | Admitting: Internal Medicine

## 2019-01-12 DIAGNOSIS — Z87891 Personal history of nicotine dependence: Secondary | ICD-10-CM | POA: Diagnosis not present

## 2019-01-12 DIAGNOSIS — E7800 Pure hypercholesterolemia, unspecified: Secondary | ICD-10-CM | POA: Diagnosis not present

## 2019-01-12 DIAGNOSIS — K319 Disease of stomach and duodenum, unspecified: Secondary | ICD-10-CM | POA: Diagnosis not present

## 2019-01-12 DIAGNOSIS — K259 Gastric ulcer, unspecified as acute or chronic, without hemorrhage or perforation: Secondary | ICD-10-CM | POA: Diagnosis not present

## 2019-01-12 DIAGNOSIS — Z79899 Other long term (current) drug therapy: Secondary | ICD-10-CM | POA: Insufficient documentation

## 2019-01-12 DIAGNOSIS — Z791 Long term (current) use of non-steroidal anti-inflammatories (NSAID): Secondary | ICD-10-CM | POA: Insufficient documentation

## 2019-01-12 DIAGNOSIS — E039 Hypothyroidism, unspecified: Secondary | ICD-10-CM | POA: Diagnosis not present

## 2019-01-12 DIAGNOSIS — I10 Essential (primary) hypertension: Secondary | ICD-10-CM | POA: Insufficient documentation

## 2019-01-12 DIAGNOSIS — R1310 Dysphagia, unspecified: Secondary | ICD-10-CM

## 2019-01-12 DIAGNOSIS — K210 Gastro-esophageal reflux disease with esophagitis: Secondary | ICD-10-CM | POA: Insufficient documentation

## 2019-01-12 DIAGNOSIS — R1319 Other dysphagia: Secondary | ICD-10-CM

## 2019-01-12 DIAGNOSIS — K222 Esophageal obstruction: Secondary | ICD-10-CM | POA: Insufficient documentation

## 2019-01-12 DIAGNOSIS — K3189 Other diseases of stomach and duodenum: Secondary | ICD-10-CM

## 2019-01-12 DIAGNOSIS — R1314 Dysphagia, pharyngoesophageal phase: Secondary | ICD-10-CM | POA: Diagnosis not present

## 2019-01-12 DIAGNOSIS — F419 Anxiety disorder, unspecified: Secondary | ICD-10-CM | POA: Diagnosis not present

## 2019-01-12 DIAGNOSIS — J449 Chronic obstructive pulmonary disease, unspecified: Secondary | ICD-10-CM | POA: Diagnosis not present

## 2019-01-12 DIAGNOSIS — K449 Diaphragmatic hernia without obstruction or gangrene: Secondary | ICD-10-CM | POA: Insufficient documentation

## 2019-01-12 HISTORY — PX: ESOPHAGEAL DILATION: SHX303

## 2019-01-12 HISTORY — PX: ESOPHAGOGASTRODUODENOSCOPY: SHX5428

## 2019-01-12 HISTORY — PX: BIOPSY: SHX5522

## 2019-01-12 SURGERY — EGD (ESOPHAGOGASTRODUODENOSCOPY)
Anesthesia: Moderate Sedation

## 2019-01-12 MED ORDER — MIDAZOLAM HCL 5 MG/5ML IJ SOLN
INTRAMUSCULAR | Status: AC
  Filled 2019-01-12: qty 10

## 2019-01-12 MED ORDER — MEPERIDINE HCL 50 MG/ML IJ SOLN
INTRAMUSCULAR | Status: DC | PRN
  Administered 2019-01-12 (×2): 25 mg

## 2019-01-12 MED ORDER — MIDAZOLAM HCL 5 MG/5ML IJ SOLN
INTRAMUSCULAR | Status: DC | PRN
  Administered 2019-01-12: 1 mg via INTRAVENOUS
  Administered 2019-01-12: 2 mg via INTRAVENOUS

## 2019-01-12 MED ORDER — OMEPRAZOLE 20 MG PO CPDR: 20.0000 mg | DELAYED_RELEASE_CAPSULE | Freq: Two times a day (BID) | ORAL | 3 refills | Status: DC

## 2019-01-12 MED ORDER — MEPERIDINE HCL 50 MG/ML IJ SOLN
INTRAMUSCULAR | Status: AC
  Filled 2019-01-12: qty 1

## 2019-01-12 MED ORDER — LIDOCAINE VISCOUS HCL 2 % MT SOLN
OROMUCOSAL | Status: DC | PRN
  Administered 2019-01-12: 1 via OROMUCOSAL

## 2019-01-12 MED ORDER — LIDOCAINE VISCOUS HCL 2 % MT SOLN
OROMUCOSAL | Status: AC
  Filled 2019-01-12: qty 15

## 2019-01-12 MED ORDER — SODIUM CHLORIDE 0.9 % IV SOLN
INTRAVENOUS | Status: DC
  Administered 2019-01-12: 11:00:00 via INTRAVENOUS

## 2019-01-12 MED ORDER — STERILE WATER FOR IRRIGATION IR SOLN
Status: DC | PRN
  Administered 2019-01-12: 11:00:00

## 2019-01-12 NOTE — Op Note (Signed)
[facility] Patient Name: Francisco Butler Procedure Date: 01/12/2019 10:50 AM MRN: [id number] Date of Birth: May 07, 1946 Attending MD: Hildred Laser , MD CSN: [id number] Age: 73 Admit Type: Outpatient Procedure:                Upper GI endoscopy Indications:              Esophageal dysphagia Providers:                Hildred Laser, MD, Gerome Sam, RN, Randa Spike, Technician Referring MD:             Ray Church,, FNP Medicines:                Lidocaine jelly, Meperidine 25 mg IV, Midazolam 3                            mg IV Complications:            No immediate complications. Estimated Blood Loss:     Estimated blood loss was minimal. Procedure:                Pre-Anesthesia Assessment:                           - Prior to the procedure, a History and Physical                            was performed, and patient medications and                            allergies were reviewed. The patient's tolerance of                            previous anesthesia was also reviewed. The risks                            and benefits of the procedure and the sedation                            options and risks were discussed with the patient.                            All questions were answered, and informed consent                            was obtained. Prior Anticoagulants: The patient                            last took aspirin 1 day prior to the procedure. ASA                            Grade Assessment: II - A patient with mild systemic  disease. After reviewing the risks and benefits,                            the patient was deemed in satisfactory condition to                            undergo the procedure.                           After obtaining informed consent, the endoscope was                            passed under direct vision. Throughout the                            procedure, the patient's blood  pressure, pulse, and                            oxygen saturations were monitored continuously. The                            [id number]-[id number] ([id number]) was introduced through the                            mouth, and advanced to the second part of duodenum.                            The upper GI endoscopy was accomplished without                            difficulty. The patient tolerated the procedure                            well. Scope In: 11:19:08 AM Scope Out: 11:30:08 AM Total Procedure Duration: 0 hours 11 minutes 0 seconds  Findings:      The proximal esophagus and mid esophagus were normal.      LA Grade B (one or more mucosal breaks greater than 5 mm, not extending       between the tops of two mucosal folds) esophagitis with no bleeding was       found 34 to 35 cm from the incisors.      One benign-appearing, intrinsic mild stenosis was found 35 cm from the       incisors. This stenosis measured less than one cm (in length). The       stenosis was traversed. A TTS dilator was passed through the scope.       Dilation with a 15-16.5-18 mm balloon dilator was performed to 15 mm,       16.5 mm and 18 mm. The dilation site was examined and showed mild       mucosal disruption, mild improvement in luminal narrowing and no       perforation.      A 4 cm hiatal hernia was present.      Two non-bleeding cratered gastric ulcers with no stigmata of bleeding       were found in the gastric  antrum. The largest lesion was 4 mm in largest       dimension. Biopsies were taken with a cold forceps for histology.      Multiple erosions were found in the gastric antrum.      The exam of the stomach was otherwise normal.      The duodenal bulb and second portion of the duodenum were normal. Impression:               - Normal proximal esophagus and mid esophagus.                           - LA Grade B reflux esophagitis.                           - Benign-appearing esophageal stenosis. Dilated.                            - 4 cm hiatal hernia.                           -Two non-bleeding gastric ulcers with no stigmata                            of bleeding. Biopsied.                           - Erosive gastropathy.                           - Normal duodenal bulb and second portion of the                            duodenum. Moderate Sedation:      Moderate (conscious) sedation was administered by the endoscopy nurse       and supervised by the endoscopist. The following parameters were       monitored: oxygen saturation, heart rate, blood pressure, CO2       capnography and response to care. Total physician intraservice time was       15 minutes. Recommendation:           - Patient has a contact number available for                            emergencies. The signs and symptoms of potential                            delayed complications were discussed with the                            patient. Return to normal activities tomorrow.                            Written discharge instructions were provided to the                            patient.                           -  Resume previous diet today.                           - Increase Omeprazole to 20 mg po bid.                           - Continue present medications. Use Nabumetone on                            as needed basis.                           - No aspirin, ibuprofen, naproxen, or other                            non-steroidal anti-inflammatory drugs for 1 day.                           - Await pathology results. Procedure Code(s):        --- Professional ---                           [phone], Esophagogastroduodenoscopy, flexible,                            transoral; with transendoscopic balloon dilation of                            esophagus (less than 30 mm diameter)                           G0500, Moderate sedation services provided by the                            same physician or other qualified health care                             professional performing a gastrointestinal                            endoscopic service that sedation supports,                            requiring the presence of an independent trained                            observer to assist in the monitoring of the                            patient's level of consciousness and physiological                            status; initial 15 minutes of intra-service time;                            patient age  5 years or older (additional time may                            be reported with [phone], as appropriate) Diagnosis Code(s):        --- Professional ---                           K21.0, Gastro-esophageal reflux disease with                            esophagitis                           K22.2, Esophageal obstruction                           K44.9, Diaphragmatic hernia without obstruction or                            gangrene                           K25.9, Gastric ulcer, unspecified as acute or                            chronic, without hemorrhage or perforation                           K31.89, Other diseases of stomach and duodenum                           R13.14, Dysphagia, pharyngoesophageal phase CPT copyright 2018 American [employer]. All rights reserved. The codes documented in this report are preliminary and upon coder review may  be revised to meet current compliance requirements. Hildred Laser, MD Hildred Laser, MD 01/12/2019 11:49:01 AM This report has been signed electronically. Number of Addenda: 0

## 2019-01-12 NOTE — H&P (Signed)
Francisco Butler is an 73 y.o. male.   Chief Complaint: Patient is here for EGD and ED. HPI: Patient is 73 year old Caucasian male who presents with over a history of intermittent dysphagia to solids.  He has had his esophagus dilated 6 or 7 years ago and it did provide relief for a while.  He says heartburn is well controlled with omeprazole.  He points to the suprasternal area as site of bolus obstruction.  He denies nausea vomiting epigastric pain or melena.  He has history of gastric ulcer secondary to NSAID use about 7 years ago. He had barium study in July 2019 which revealed small sliding hiatal hernia and suggested esophageal dysmotility.  Past Medical History:  Diagnosis Date  .  History of gastric ulcer(NSAID use). 08/07/2017  . Anxiety   . Arthritis   . COPD (chronic obstructive pulmonary disease) ([place])   . GERD (gastroesophageal reflux disease)   . H/O hiatal hernia   . High cholesterol   . Hypertension   . Hypothyroidism   . Seasonal allergies      Past Surgical History:  Procedure Laterality Date  . ANTERIOR CERVICAL DECOMP/DISCECTOMY FUSION N/A 01/05/2014   Procedure: Cervical five-six, Cervical six-seven, Cervical seven-thoracic one anterior cervical decompression with fusion interbody prosthesis plating and bonegraft;  Surgeon: Erline Levine, MD;  Location: [place] [facility];  Service: Neurosurgery;  Laterality: N/A;  Cervical five-six, Cervical six-seven, Cervical seven-thoracic one anterior cervical decompression with fusion interbody prosthesis plating  . CHOLECYSTECTOMY N/A 08/23/2017   Procedure: LAPAROSCOPIC CHOLECYSTECTOMY;  Surgeon: Aviva Signs, MD;  Location: [facility];  Service: General;  Laterality: N/A;  . EYE SURGERY Bilateral    lazy eye surgery  . FRACTURE SURGERY Right    foot  . FRACTURE SURGERY Right    collar bone  . HERNIA REPAIR     umbilical hernia repair  . KNEE SURGERY    . SHOULDER ARTHROSCOPY Right   . TONSILLECTOMY      History reviewed. No  pertinent family history. Social History:  reports that he quit smoking about 5 years ago. His smoking use included cigarettes. He has a 40.00 pack-year smoking history. He has never used smokeless tobacco. He reports current alcohol use. He reports that he does not use drugs.  Allergies:  Allergies  Allergen Reactions  . Atorvastatin     Elevated heart rate  . Biaxin [Clarithromycin] Other (See Comments)    hiccups   . Meloxicam Other (See Comments)    ulcers    Medications Prior to Admission  Medication Sig Dispense Refill  . cholecalciferol (VITAMIN D3) 25 MCG (1000 UT) tablet Take 1,000 Units by mouth daily.    . Levothyroxine Sodium 137 MCG CAPS Take 137 mcg by mouth daily before breakfast.     . lisinopril-hydrochlorothiazide (PRINZIDE,ZESTORETIC) 20-12.5 MG per tablet Take 1 tablet by mouth daily.    . montelukast (SINGULAIR) 10 MG tablet Take 10 mg by mouth daily with breakfast.    . nabumetone (RELAFEN) 750 MG tablet Take 750 mg by mouth daily.     [name] omeprazole (PRILOSEC) 20 MG capsule Take 20 mg by mouth daily before breakfast.     . simvastatin (ZOCOR) 20 MG tablet Take 20 mg by mouth daily.    . cyclobenzaprine (FLEXERIL) 10 MG tablet Take 10 mg by mouth daily as needed for muscle spasms.    . diazepam (VALIUM) 5 MG tablet Take 1 tablet (5 mg total) by mouth every 6 (six) hours as needed for  muscle spasms. (Patient taking differently: Take 5 mg by mouth every 6 (six) hours as needed for muscle spasms (for claustrophobia with travel). ) 60 tablet 0  . diclofenac sodium (VOLTAREN) 1 % GEL Apply 1 application topically 3 (three) times daily as needed (pain).      No results found for this or any previous visit (from the past 48 hour(s)). No results found.  ROS  Blood pressure (!) 144/66, pulse 70, temperature (!) 97.5 F (36.4 C), temperature source Oral, resp. rate 17, SpO2 97 %. Physical Exam  Constitutional: He appears well-developed and well-nourished.  HENT:   Mouth/Throat: Oropharynx is clear and moist.  He has upper and lower dentures.  Eyes: Conjunctivae are normal. No scleral icterus.  Neck: No thyromegaly present.  Cardiovascular: Normal rate, regular rhythm and normal heart sounds.  No murmur heard. Respiratory: Effort normal and breath sounds normal.  GI: Soft. He exhibits no distension and no mass. There is no abdominal tenderness.  Musculoskeletal:        General: No edema.  Lymphadenopathy:    He has no cervical adenopathy.  Neurological: He is alert.  Skin: Skin is warm and dry.     Assessment/Plan Solid food dysphagia and patient with chronic GERD. EGD with ED.  Hildred Laser, MD 01/12/2019, 11:08 AM

## 2019-01-12 NOTE — Discharge Instructions (Signed)
Increase omeprazole to 20 mg by mouth 30 minutes before breakfast and evening meal daily. If possible take nabumetone or Relafen on as-needed basis instead of every day. Resume other medications as before. Resume usual diet. No driving for 24 hours. Physician will call with biopsy results.   Upper Endoscopy, Adult, Care After This sheet gives you information about how to care for yourself after your procedure. Your health care provider may also give you more specific instructions. If you have problems or questions, contact your health care provider. What can I expect after the procedure? After the procedure, it is common to have:  A sore throat.  Mild stomach pain or discomfort.  Bloating.  Nausea. Follow these instructions at home:   Follow instructions from your health care provider about what to eat or drink after your procedure.  Return to your normal activities as told by your health care provider. Ask your health care provider what activities are safe for you.  Take over-the-counter and prescription medicines only as told by your health care provider.  Do not drive for 24 hours if you were given a sedative during your procedure.  Keep all follow-up visits as told by your health care provider. This is important. Contact a health care provider if you have:  A sore throat that lasts longer than one day.  Trouble swallowing. Get help right away if:  You vomit blood or your vomit looks like coffee grounds.  You have: ? A fever. ? Bloody, black, or tarry stools. ? A severe sore throat or you cannot swallow. ? Difficulty breathing. ? Severe pain in your chest or abdomen. Summary  After the procedure, it is common to have a sore throat, mild stomach discomfort, bloating, and nausea.  Do not drive for 24 hours if you were given a sedative during the procedure.  Follow instructions from your health care provider about what to eat or drink after your  procedure.  Return to your normal activities as told by your health care provider. This information is not intended to replace advice given to you by your health care provider. Make sure you discuss any questions you have with your health care provider. Document Released: 05/20/2012 Document Revised: 04/21/2018 Document Reviewed: 04/21/2018 Elsevier Interactive Patient Education  2019 [employer]  Peptic Ulcer  A peptic ulcer is a painful sore in the lining of your stomach or the first part of your small intestine. What are the causes? Common causes of this condition include:  An infection.  Using certain pain medicines too often or too much. What increases the risk? You are more likely to get this condition if you:  Smoke.  Have a family history of ulcer disease.  Drink alcohol.  Have been hospitalized in an intensive care unit (ICU). What are the signs or symptoms? Symptoms include:  Burning pain in the area between the chest and the belly button. The pain may: ? Not go away (be persistent). ? Be worse when your stomach is empty. ? Be worse at night.  Heartburn.  Feeling sick to your stomach (nauseous) and throwing up (vomiting).  Bloating. If the ulcer results in bleeding, it can cause you to:  Have poop (stool) that is black and looks like tar.  Throw up bright red blood.  Throw up material that looks like coffee grounds. How is this treated? Treatment for this condition may include:  Stopping things that can cause the ulcer, such as: ? Smoking. ? Using pain medicines.  Medicines  to reduce stomach acid.  Antibiotic medicines if the ulcer is caused by an infection.  A procedure that is done using a small, flexible tube that has a camera at the end (upper endoscopy). This may be done if you have a bleeding ulcer.  Surgery. This may be needed if: ? You have a lot of bleeding. ? The ulcer caused a hole somewhere in the digestive system. Follow these  instructions at home:  Do not drink alcohol if your doctor tells you not to drink.  Limit how much caffeine you take in.  Do not use any products that contain nicotine or tobacco, such as cigarettes, e-cigarettes, and chewing tobacco. If you need help quitting, ask your doctor.  Take over-the-counter and prescription medicines only as told by your doctor. ? Do not stop or change your medicines unless you talk with your doctor about it first. ? Do not take aspirin, ibuprofen, or other NSAIDs unless your doctor told you to do so.  Keep all follow-up visits as told by your doctor. This is important. Contact a doctor if:  You do not get better in 7 days after you start treatment.  You keep having an upset stomach (indigestion) or heartburn. Get help right away if:  You have sudden, sharp pain in your belly (abdomen).  You have belly pain that does not go away.  You have bloody poop (stool) or black, tarry poop.  You throw up blood. It may look like coffee grounds.  You feel light-headed or feel like you may pass out (faint).  You get weak.  You get sweaty or feel sticky and cold to the touch (clammy). Summary  Symptoms of a peptic ulcer include burning pain in the area between the chest and the belly button.  Take medicines only as told by your doctor.  Limit how much alcohol and caffeine you have.  Keep all follow-up visits as told by your doctor. This information is not intended to replace advice given to you by your health care provider. Make sure you discuss any questions you have with your health care provider. Document Released: 02/13/2010 Document Revised: 05/27/2018 Document Reviewed: 05/27/2018 Elsevier Interactive Patient Education  2019 [place], Adult     A hernia happens when tissue inside your body pushes out through a weak spot in your belly muscles (abdominal wall). This makes a round lump (bulge). The lump may be:  In a scar from surgery  that was done in your belly (incisional hernia).  Near your belly button (umbilical hernia).  In your groin (inguinal hernia). Your groin is the area where your leg meets your lower belly (abdomen). This kind of hernia could also be: ? In your scrotum, if you are male. ? In folds of skin around your vagina, if you are male.  In your upper thigh (femoral hernia).  Inside your belly (hiatal hernia). This happens when your stomach slides above the muscle between your belly and your chest (diaphragm). If your hernia is small and it does not cause pain, you may not need treatment. If your hernia is large or it causes pain, you may need surgery. Follow these instructions at home: Activity  Avoid stretching or overusing (straining) the muscles near your hernia. Straining can happen when you: ? Lift something heavy. ? Poop (have a bowel movement).  Do not lift anything that is heavier than 10 lb (4.5 kg), or the limit that you are told, until your doctor says that it is  safe.  Use the strength of your legs when you lift something heavy. Do not use only your back muscles to lift. General instructions  Do these things if told by your doctor so you do not have trouble pooping (constipation): ? Drink enough fluid to keep your pee (urine) pale yellow. ? Eat foods that are high in fiber. These include fresh fruits and vegetables, whole grains, and beans. ? Limit foods that are high in fat and processed sugars. These include foods that are fried or sweet. ? Take medicine for trouble pooping.  When you cough, try to cough gently.  You may try to push your hernia in by very gently pressing on it when you are lying down. Do not try to force the bulge back in if it will not push in easily.  If you are overweight, work with your doctor to lose weight safely.  Do not use any products that have nicotine or tobacco in them. These include cigarettes and e-cigarettes. If you need help quitting, ask your  doctor.  If you will be having surgery (hernia repair), watch your hernia for changes in shape, size, or color. Tell your doctor if you see any changes.  Take over-the-counter and prescription medicines only as told by your doctor.  Keep all follow-up visits as told by your doctor. Contact a doctor if:  You get new pain, swelling, or redness near your hernia.  You poop fewer times in a week than normal.  You have trouble pooping.  You have poop (stool) that is more dry than normal.  You have poop that is harder or larger than normal. Get help right away if:  You have a fever.  You have belly pain that gets worse.  You feel sick to your stomach (nauseous).  You throw up (vomit).  Your hernia cannot be pushed in by very gently pressing on it when you are lying down. Do not try to force the bulge back in if it will not push in easily.  Your hernia: ? Changes in shape or size. ? Changes color. ? Feels hard or it hurts when you touch it. These symptoms may represent a serious problem that is an emergency. Do not wait to see if the symptoms will go away. Get medical help right away. Call your local emergency services (911 in the [place] Summary  A hernia happens when tissue inside your body pushes out through a weak spot in the belly muscles. This creates a bulge.  If your hernia is small and it does not hurt, you may not need treatment. If your hernia is large or it hurts, you may need surgery.  If you will be having surgery, watch your hernia for changes in shape, size, or color. Tell your doctor about any changes. This information is not intended to replace advice given to you by your health care provider. Make sure you discuss any questions you have with your health care provider. Document Released: 05/09/2010 Document Revised: 08/21/2017 Document Reviewed: 08/21/2017 Elsevier Interactive Patient Education  2019 [employer].

## 2019-01-16 ENCOUNTER — Encounter (HOSPITAL_COMMUNITY): Payer: Self-pay | Admitting: Internal Medicine

## 2019-10-05 ENCOUNTER — Ambulatory Visit (INDEPENDENT_AMBULATORY_CARE_PROVIDER_SITE_OTHER): Payer: Medicare Other | Admitting: Internal Medicine

## 2019-10-05 ENCOUNTER — Encounter (INDEPENDENT_AMBULATORY_CARE_PROVIDER_SITE_OTHER): Payer: Self-pay | Admitting: Internal Medicine

## 2019-10-05 ENCOUNTER — Other Ambulatory Visit: Payer: Self-pay

## 2019-10-05 DIAGNOSIS — Z8711 Personal history of peptic ulcer disease: Secondary | ICD-10-CM | POA: Diagnosis not present

## 2019-10-05 DIAGNOSIS — K921 Melena: Secondary | ICD-10-CM | POA: Diagnosis not present

## 2019-10-05 DIAGNOSIS — K219 Gastro-esophageal reflux disease without esophagitis: Secondary | ICD-10-CM | POA: Insufficient documentation

## 2019-10-05 DIAGNOSIS — K2100 Gastro-esophageal reflux disease with esophagitis, without bleeding: Secondary | ICD-10-CM | POA: Diagnosis not present

## 2019-10-05 DIAGNOSIS — K222 Esophageal obstruction: Secondary | ICD-10-CM

## 2019-10-05 MED ORDER — OMEPRAZOLE 20 MG PO CPDR: 20.0000 mg | DELAYED_RELEASE_CAPSULE | Freq: Every day | ORAL | 3 refills | Status: DC

## 2019-10-05 NOTE — Patient Instructions (Signed)
Please call office if you have another episode of rectal bleeding in which case we will proceed with colonoscopy. Notify if swallowing difficulty worsens. Remember to chew food thoroughly and eat slowly.

## 2019-10-05 NOTE — Progress Notes (Signed)
Presenting complaint;  Rectal bleeding  Database and subjective:  Patient is 73 year old Caucasian male who was last seen in January 2020 for solid food dysphagia.  He underwent esophagogastroduodenoscopy revealing distal esophageal stricture erosive reflux esophagitis small sliding hiatal hernia as well as gastritis with small antral ulcer.  Esophageal stricture was dilated with balloon to 18 mm.  Biopsy from gastric ulcer was negative for H. pylori.  Ulcer was felt to be due to NSAID use and not ischemic in nature.  Patient was advised to keep NSAID use to minimum.  He now returns with complaint of single episode of rectal bleeding which occurred about 2 months ago.  He noted small amount of fresh blood.  He has noted anal tags since then.  He feels he might have been straining when the bleeding occurred.  He has not had any more episodes.  He says his bowels move regularly when he eats the right kind of food.  He denies abdominal or rectal pain.  He also denies melena.  He has good appetite and his weight has been stable. He takes 1 Relafen every 30 to 60 days.  He is using ibuprofen PM no more than once a week.  He has intermittent dysphagia when he eats fast and does not chew his food good.  He is not using lower dentures because they do not fit anymore.  His last colonoscopy was in September 2014 with removal of single polyp and was hyperplastic.  He also had colonoscopy in March 2017 which revealed hyperplastic polyp. Family history is negative for colorectal carcinoma but positive for brain cancer in his father who was in his 72s at the time of diagnosis and died within 68 months.  His sister also suffered brain cancer and died within few months.  She was in her 47s.  Patient's nephew also had surgery for brain cancer at age 8 and lived to be 36. Patient is not sure if they all had same tissue type. He drinks alcohol socially but not daily.  He quit cigarette smoking 6 years ago after having  smoked for about 40 years.  Smoked about a pack a day.  Current Medications: Outpatient Encounter Medications as of 10/05/2019  Medication Sig  . cholecalciferol (VITAMIN D3) 25 MCG (1000 UT) tablet Take 1,000 Units by mouth daily.  . cyclobenzaprine (FLEXERIL) 10 MG tablet Take 10 mg by mouth daily as needed for muscle spasms.  . diazepam (VALIUM) 5 MG tablet Take 1 tablet (5 mg total) by mouth every 6 (six) hours as needed for muscle spasms. (Patient taking differently: Take 5 mg by mouth every 6 (six) hours as needed for muscle spasms (for claustrophobia with travel). )  . diclofenac sodium (VOLTAREN) 1 % GEL Apply 1 application topically 3 (three) times daily as needed (pain).  . Levothyroxine Sodium 137 MCG CAPS Take 137 mcg by mouth daily before breakfast.   . lisinopril-hydrochlorothiazide (PRINZIDE,ZESTORETIC) 20-12.5 MG per tablet Take 1 tablet by mouth daily.  . montelukast (SINGULAIR) 10 MG tablet Take 10 mg by mouth daily with breakfast.  . nabumetone (RELAFEN) 750 MG tablet Take 1 tablet (750 mg total) by mouth daily as needed.  [name] omeprazole (PRILOSEC) 20 MG capsule Take 1 capsule (20 mg total) by mouth 2 (two) times daily before a meal.  . simvastatin (ZOCOR) 20 MG tablet Take 20 mg by mouth daily.   No facility-administered encounter medications on file as of 10/05/2019.    Past Medical History:  Diagnosis Date  .  History of gastric ulcer(2012 and February 2020) 08/07/2017  . Anxiety   . Arthritis   . COPD (chronic obstructive pulmonary disease) ([place])   . GERD (gastroesophageal reflux disease)   . H/O hiatal hernia   . High cholesterol   . Hypertension   . Hypothyroidism   . Seasonal allergies    Past Surgical History:  Procedure Laterality Date  . ANTERIOR CERVICAL DECOMP/DISCECTOMY FUSION N/A 01/05/2014   Procedure: Cervical five-six, Cervical six-seven, Cervical seven-thoracic one anterior cervical decompression with fusion interbody prosthesis plating and bonegraft;   Surgeon: Erline Levine, MD;  Location: [place] [facility];  Service: Neurosurgery;  Laterality: N/A;  Cervical five-six, Cervical six-seven, Cervical seven-thoracic one anterior cervical decompression with fusion interbody prosthesis plating  . BIOPSY  01/12/2019   Procedure: BIOPSY;  Surgeon: Rogene Houston, MD;  Location: [facility];  Service: Endoscopy;;  gastric ulcer  . CHOLECYSTECTOMY N/A 08/23/2017   Procedure: LAPAROSCOPIC CHOLECYSTECTOMY;  Surgeon: Aviva Signs, MD;  Location: [facility];  Service: General;  Laterality: N/A;  . ESOPHAGEAL DILATION N/A 01/12/2019   Procedure: ESOPHAGEAL DILATION;  Surgeon: Rogene Houston, MD;  Location: [facility];  Service: Endoscopy;  Laterality: N/A;  . ESOPHAGOGASTRODUODENOSCOPY N/A 01/12/2019   Procedure: ESOPHAGOGASTRODUODENOSCOPY (EGD);  Surgeon: Rogene Houston, MD;  Location: [facility];  Service: Endoscopy;  Laterality: N/A;  12:00  . EYE SURGERY Bilateral    lazy eye surgery  . FRACTURE SURGERY Right    foot  . FRACTURE SURGERY Right    collar bone  . HERNIA REPAIR     umbilical hernia repair  . KNEE SURGERY    . SHOULDER ARTHROSCOPY Right   . TONSILLECTOMY       Objective: Blood pressure 119/74, pulse 95, temperature 97.8 F (36.6 C), temperature source Oral, height 5\' 10"  (1.778 m), weight 193 lb 3.2 oz (87.6 kg). Patient is alert and in no acute distress. He is wearing a facial mask. Conjunctiva is pink. Sclera is nonicteric Oropharyngeal mucosa is normal. No neck masses or thyromegaly noted. Cardiac exam with regular rhythm normal S1 and S2. No murmur or gallop noted. Lungs are clear to auscultation. Abdomen is symmetrical.  Bowel sounds are normal.  On palpation it is soft and nontender with organomegaly or masses.   Rectal examination reveals single soft anal tag on the right side of anal orifice.  Digital exam reveals no abnormality.  He has formed/soft stool in the vault.  Stool is guaiac negative. No LE edema or clubbing  noted.  Labs/studies Results: No recent lab data available H&H was 12.5 and 37.2 on 08/07/2017.   Assessment:  #1.  Hematochezia.  Patient had single episode occurring with his bowel movement.  He does have anal tag.  Rectal examination is normal and stool is guaiac negative.  Colonoscopy in 2007 and more recently in September 2014 revealed hyperplastic polyp each time. Suspect hematochezia secondary to hemorrhoids. If he has another episode will proceed with diagnostic colonoscopy.  #2.  GERD complicated by esophageal stricture which was dilated in February 2020.  He is having intermittent dysphagia when he he eats fast.  He has not had any episodes of food impaction.  Will consider repeat dilation if dysphagia progresses.  #3.  History of gastric ulcer.  He has a history of gastric ulcer dating back to 2012.  Follow-up EGD in February 2013 revealed complete healing.  EGD in February 2020 revealed small antral ulcer.  Biopsy negative for H. pylori infection.  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    Plan:  Patient will call office if he has another episode of hematochezia in which case we will proceed with diagnostic colonoscopy. Patient advised to keep OTC NSAID use to minimum. Omeprazole dose changed to 20 mg every morning to be taken 30 minutes before breakfast. Patient advised to call office if he has melena or dysphagia worsens. Request copy of recent blood work from PCPs office since hemoglobin was 12.5 in March 2018. Office visit in 1 year.

## 2020-06-15 ENCOUNTER — Other Ambulatory Visit: Payer: Self-pay

## 2020-06-15 ENCOUNTER — Ambulatory Visit (INDEPENDENT_AMBULATORY_CARE_PROVIDER_SITE_OTHER): Payer: Medicare Other | Admitting: Gastroenterology

## 2020-06-15 ENCOUNTER — Other Ambulatory Visit (INDEPENDENT_AMBULATORY_CARE_PROVIDER_SITE_OTHER): Payer: Self-pay | Admitting: *Deleted

## 2020-06-15 ENCOUNTER — Encounter (INDEPENDENT_AMBULATORY_CARE_PROVIDER_SITE_OTHER): Payer: Self-pay | Admitting: *Deleted

## 2020-06-15 ENCOUNTER — Encounter (INDEPENDENT_AMBULATORY_CARE_PROVIDER_SITE_OTHER): Payer: Self-pay | Admitting: Gastroenterology

## 2020-06-15 VITALS — BP 135/70 | HR 74 | Temp 98.1°F | Ht 70.0 in | Wt 186.2 lb

## 2020-06-15 DIAGNOSIS — R1310 Dysphagia, unspecified: Secondary | ICD-10-CM

## 2020-06-15 DIAGNOSIS — K219 Gastro-esophageal reflux disease without esophagitis: Secondary | ICD-10-CM | POA: Diagnosis not present

## 2020-06-15 DIAGNOSIS — R1319 Other dysphagia: Secondary | ICD-10-CM

## 2020-06-15 NOTE — Patient Instructions (Signed)
We are scheduling endoscopy for evaluation. Continue omeprazole 20mg  once a day before meals.

## 2020-06-15 NOTE — Progress Notes (Signed)
Patient profile: Francisco Butler is a 74 y.o. male seen for evaluation of dysphagia. Last seen 10/2019 for intermittent rectal bleeding - recommended to monitor symptoms and plan for colonoscopy if symptoms returned.   History of Present Illness: Francisco Butler is seen today for dysphagia - reports dysphagia ongoing since mid June 2021, can occur w/ meats such as chicken sticking in upper esopahgeal. Ceral, crackers also caused issues and he has stopped eating these. No liquids dysphagia. Rare pill dysphagia. He is on omeprazole 20mg  which usually works well, if has occasional GERD symptoms - uses rolaids or Tums (ie. [place] foods).  He remains upright after eating.  He is having dark stools on iron (started on iron last year). Having a BM daily.  Denies any further issues with bright red rectal bleeding.  Reports PCP checked a Hemoccult recently and this was negative.  Denies constipation, diarrhea, lower abdominal pain.  Former smoker (quit 6 years ago). Rare alcohol. Rare nsaids.   Wt Readings from Last 3 Encounters:  06/15/20 186 lb 3.2 oz (84.5 kg)  10/05/19 193 lb 3.2 oz (87.6 kg)  12/16/18 200 lb 1.6 oz (90.8 kg)     Last Colonoscopy: 08/2013--Two rt colon polyps, one is a small polyp 5 mm mid right colon removed by snare and cautery and a large almost 1 cm sessile polyp in the upper rt colon removed.  Rest of colon normal except for some diverticulosis left colon.  Biopsy: Hyperplastic polyps.    Last Endoscopy: 01/2019-Normal proximal esophagus and mid esophagus. - LA Grade B reflux esophagitis. - Benign-appearing esophageal stenosis. Dilated. - 4 cm hiatal hernia. -Two non-bleeding gastric ulcers with no stigmata of bleeding. Biopsied. - Erosive gastropathy. - Normal duodenal bulb and second portion of the duodenum   Past Medical History:  Past Medical History:  Diagnosis Date  . Abdominal pain, chronic, epigastric 08/07/2017  . Anxiety   . Arthritis   . COPD (chronic  obstructive pulmonary disease) ([place])   . GERD (gastroesophageal reflux disease)   . H/O hiatal hernia   . High cholesterol   . Hypertension   . Hypothyroidism   . Seasonal allergies     Problem List: Patient Active Problem List   Diagnosis Date Noted  . Hematochezia 10/05/2019  . GERD (gastroesophageal reflux disease) 10/05/2019  . Benign esophageal stricture 10/05/2019  . History of peptic ulcer disease 10/05/2019  . Esophageal dysphagia 12/16/2018  . Chronic cholecystitis   . Abdominal pain, chronic, epigastric 08/07/2017  . Cervical spondylosis with myelopathy and radiculopathy 01/05/2014  . Displacement of cervical intervertebral disc without myelopathy 01/05/2014    Past Surgical History: Past Surgical History:  Procedure Laterality Date  . ANTERIOR CERVICAL DECOMP/DISCECTOMY FUSION N/A 01/05/2014   Procedure: Cervical five-six, Cervical six-seven, Cervical seven-thoracic one anterior cervical decompression with fusion interbody prosthesis plating and bonegraft;  Surgeon: Erline Levine, MD;  Location: [place] [facility];  Service: Neurosurgery;  Laterality: N/A;  Cervical five-six, Cervical six-seven, Cervical seven-thoracic one anterior cervical decompression with fusion interbody prosthesis plating  . BIOPSY  01/12/2019   Procedure: BIOPSY;  Surgeon: Rogene Houston, MD;  [facility];  Service: Endoscopy;;  gastric ulcer  . CHOLECYSTECTOMY N/A 08/23/2017   Procedure: LAPAROSCOPIC CHOLECYSTECTOMY;  Surgeon: Aviva Signs, MD;  Location: [facility];  Service: General;  Laterality: N/A;  . ESOPHAGEAL DILATION N/A 01/12/2019   Procedure: ESOPHAGEAL DILATION;  Surgeon: Rogene Houston, MD;  [facility];  Service: Endoscopy;  Laterality: N/A;  .  ESOPHAGOGASTRODUODENOSCOPY N/A 01/12/2019   Procedure: ESOPHAGOGASTRODUODENOSCOPY (EGD);  Surgeon: Rogene Houston, MD;  [facility];  Service: Endoscopy;  Laterality: N/A;  12:00  . EYE SURGERY Bilateral    lazy eye  surgery  . FRACTURE SURGERY Right    foot  . FRACTURE SURGERY Right    collar bone  . HERNIA REPAIR     umbilical hernia repair  . KNEE SURGERY    . SHOULDER ARTHROSCOPY Right   . TONSILLECTOMY      Allergies: Allergies  Allergen Reactions  . Atorvastatin     Elevated heart rate  . Biaxin [Clarithromycin] Other (See Comments)    hiccups   . Meloxicam Other (See Comments)    ulcers      Home Medications:  Current Outpatient Medications:  .  cholecalciferol (VITAMIN D3) 25 MCG (1000 UT) tablet, Take 1,000 Units by mouth daily., Disp: , Rfl:  .  cyclobenzaprine (FLEXERIL) 10 MG tablet, Take 10 mg by mouth daily as needed for muscle spasms., Disp: , Rfl:  .  diazepam (VALIUM) 5 MG tablet, Take 1 tablet (5 mg total) by mouth every 6 (six) hours as needed for muscle spasms. (Patient taking differently: Take 5 mg by mouth every 6 (six) hours as needed for muscle spasms (for claustrophobia with travel). ), Disp: 60 tablet, Rfl: 0 .  diclofenac sodium (VOLTAREN) 1 % GEL, Apply 1 application topically 3 (three) times daily as needed (pain)., Disp: , Rfl:  .  Levothyroxine Sodium 137 MCG CAPS, Take 137 mcg by mouth daily before breakfast. , Disp: , Rfl:  .  lisinopril-hydrochlorothiazide (PRINZIDE,ZESTORETIC) 20-12.5 MG per tablet, Take 1 tablet by mouth daily., Disp: , Rfl:  .  nabumetone (RELAFEN) 750 MG tablet, Take 1 tablet (750 mg total) by mouth daily as needed., Disp: , Rfl:  .  omeprazole (PRILOSEC) 20 MG capsule, Take 1 capsule (20 mg total) by mouth daily before breakfast., Disp: 60 capsule, Rfl: 3 .  simvastatin (ZOCOR) 20 MG tablet, Take 20 mg by mouth daily., Disp: , Rfl:    Family History: family history is not on file.    Social History:   reports that he quit smoking about 6 years ago. His smoking use included cigarettes. He has a 40.00 pack-year smoking history. He has never used smokeless tobacco. He reports current alcohol use. He reports that he does not use drugs.    Review of Systems: Constitutional: Denies weight loss/weight gain  Eyes: No changes in vision. ENT: No oral lesions, sore throat.  GI: see HPI.  Heme/Lymph: No easy bruising.  CV: No chest pain.  GU: No hematuria.  Integumentary: No rashes.  Neuro: No headaches.  Psych: No depression/anxiety.  Endocrine: No heat/cold intolerance.  Allergic/Immunologic: No urticaria.  Resp: No cough, SOB.  Musculoskeletal: No joint swelling.    Physical Examination: BP 135/70 (BP Location: Right Arm, Patient Position: Sitting, Cuff Size: Normal)   Pulse 74   Temp 98.1 F (36.7 C) (Oral)   Ht 5\' 10"  (1.778 m)   Wt 186 lb 3.2 oz (84.5 kg)   BMI 26.72 kg/m  Gen: NAD, alert and oriented x 4 HEENT: PEERLA, EOMI, Neck: supple, no JVD Chest: CTA bilaterally, no wheezes, crackles, or other adventitious sounds CV: RRR, no m/g/c/r Abd: soft, NT, ND, +BS in all four quadrants; no HSM, guarding, ridigity, or rebound tenderness Ext: no edema, well perfused with 2+ pulses, Skin: no rash or lesions noted on observed skin Lymph: no noted LAD  Data Reviewed:   Barium swallow 9/20219-IMPRESSION: 1. Mild nonspecific esophageal motility disorder. 2. Small hiatal hernia. 3. Mild gastroesophageal reflux.    Assessment/Plan: Mr. Sandlin is a 74 y.o. male seen for dysphagia  1.  Dysphagia-recurrent, esophageal stricture dilated last year.  He feels the symptoms resolved have returned over the past 1 month, mainly solids dysphagia.  We will schedule endoscopy with possible dilation to assess for recurrent stricture.  He denies prior issues with sedation.  He is on low-dose PPI.   He only takes the Valium on his medication list approximately once a year-okay for fentanyl Versed.   2.  Rectal bleeding-has resolved per patient.  Hemoccult negative at PCP.  To notify us if returns.  Tavaras was seen today for follow-up.  Diagnoses and all orders for this visit:  Esophageal dysphagia  Chronic  GERD       I personally performed the service, non-incident to. (WP)  Laurine Blazer, [facility]

## 2020-07-19 ENCOUNTER — Other Ambulatory Visit (HOSPITAL_COMMUNITY)
Admission: RE | Admit: 2020-07-19 | Discharge: 2020-07-19 | Disposition: A | Discharge status: Home / Self Care | Payer: Medicare Other | Source: Ambulatory Visit | Attending: Internal Medicine | Admitting: Internal Medicine

## 2020-07-19 ENCOUNTER — Other Ambulatory Visit: Payer: Self-pay

## 2020-07-19 DIAGNOSIS — Z01812 Encounter for preprocedural laboratory examination: Secondary | ICD-10-CM | POA: Diagnosis present

## 2020-07-19 DIAGNOSIS — Z20822 Contact with and (suspected) exposure to covid-19: Secondary | ICD-10-CM | POA: Insufficient documentation

## 2020-07-19 LAB — SARS CORONAVIRUS 2 (TAT 6-24 HRS): SARS Coronavirus 2: NEGATIVE

## 2020-07-21 ENCOUNTER — Encounter (HOSPITAL_COMMUNITY): Payer: Self-pay | Admitting: Internal Medicine

## 2020-07-21 ENCOUNTER — Encounter (HOSPITAL_COMMUNITY)
Admission: RE | Disposition: A | Discharge status: Home / Self Care | Payer: Self-pay | Source: Home / Self Care | Attending: Internal Medicine

## 2020-07-21 ENCOUNTER — Ambulatory Visit (HOSPITAL_COMMUNITY)
Admission: RE | Admit: 2020-07-21 | Discharge: 2020-07-21 | Disposition: A | Discharge status: Home / Self Care | Payer: Medicare Other | Attending: Internal Medicine | Admitting: Internal Medicine

## 2020-07-21 ENCOUNTER — Other Ambulatory Visit: Payer: Self-pay

## 2020-07-21 DIAGNOSIS — J449 Chronic obstructive pulmonary disease, unspecified: Secondary | ICD-10-CM | POA: Diagnosis not present

## 2020-07-21 DIAGNOSIS — Z79899 Other long term (current) drug therapy: Secondary | ICD-10-CM | POA: Insufficient documentation

## 2020-07-21 DIAGNOSIS — Z87891 Personal history of nicotine dependence: Secondary | ICD-10-CM | POA: Diagnosis not present

## 2020-07-21 DIAGNOSIS — Z79890 Hormone replacement therapy (postmenopausal): Secondary | ICD-10-CM | POA: Diagnosis not present

## 2020-07-21 DIAGNOSIS — E039 Hypothyroidism, unspecified: Secondary | ICD-10-CM | POA: Diagnosis not present

## 2020-07-21 DIAGNOSIS — K2980 Duodenitis without bleeding: Secondary | ICD-10-CM

## 2020-07-21 DIAGNOSIS — Z886 Allergy status to analgesic agent status: Secondary | ICD-10-CM | POA: Insufficient documentation

## 2020-07-21 DIAGNOSIS — I10 Essential (primary) hypertension: Secondary | ICD-10-CM | POA: Diagnosis not present

## 2020-07-21 DIAGNOSIS — R1319 Other dysphagia: Secondary | ICD-10-CM

## 2020-07-21 DIAGNOSIS — E7800 Pure hypercholesterolemia, unspecified: Secondary | ICD-10-CM | POA: Insufficient documentation

## 2020-07-21 DIAGNOSIS — R1314 Dysphagia, pharyngoesophageal phase: Secondary | ICD-10-CM | POA: Insufficient documentation

## 2020-07-21 DIAGNOSIS — K449 Diaphragmatic hernia without obstruction or gangrene: Secondary | ICD-10-CM | POA: Insufficient documentation

## 2020-07-21 DIAGNOSIS — K222 Esophageal obstruction: Secondary | ICD-10-CM

## 2020-07-21 DIAGNOSIS — T39395A Adverse effect of other nonsteroidal anti-inflammatory drugs [NSAID], initial encounter: Secondary | ICD-10-CM | POA: Insufficient documentation

## 2020-07-21 DIAGNOSIS — Z888 Allergy status to other drugs, medicaments and biological substances status: Secondary | ICD-10-CM | POA: Diagnosis not present

## 2020-07-21 DIAGNOSIS — K219 Gastro-esophageal reflux disease without esophagitis: Secondary | ICD-10-CM | POA: Insufficient documentation

## 2020-07-21 DIAGNOSIS — M1990 Unspecified osteoarthritis, unspecified site: Secondary | ICD-10-CM | POA: Diagnosis not present

## 2020-07-21 DIAGNOSIS — K228 Other specified diseases of esophagus: Secondary | ICD-10-CM

## 2020-07-21 DIAGNOSIS — R1310 Dysphagia, unspecified: Secondary | ICD-10-CM

## 2020-07-21 HISTORY — PX: ESOPHAGEAL DILATION: SHX303

## 2020-07-21 HISTORY — PX: BIOPSY: SHX5522

## 2020-07-21 HISTORY — PX: ESOPHAGOGASTRODUODENOSCOPY: SHX5428

## 2020-07-21 SURGERY — EGD (ESOPHAGOGASTRODUODENOSCOPY)
Anesthesia: Moderate Sedation

## 2020-07-21 MED ORDER — MEPERIDINE HCL 50 MG/ML IJ SOLN
INTRAMUSCULAR | Status: AC
  Filled 2020-07-21: qty 1

## 2020-07-21 MED ORDER — LIDOCAINE VISCOUS HCL 2 % MT SOLN
OROMUCOSAL | Status: DC | PRN
  Administered 2020-07-21: 4 mL via OROMUCOSAL

## 2020-07-21 MED ORDER — MIDAZOLAM HCL 5 MG/5ML IJ SOLN
INTRAMUSCULAR | Status: DC | PRN
  Administered 2020-07-21 (×3): 1 mg via INTRAVENOUS
  Administered 2020-07-21: 2 mg via INTRAVENOUS

## 2020-07-21 MED ORDER — SODIUM CHLORIDE 0.9 % IV SOLN
INTRAVENOUS | Status: DC
  Administered 2020-07-21: 20 mL/h via INTRAVENOUS

## 2020-07-21 MED ORDER — LIDOCAINE VISCOUS HCL 2 % MT SOLN
OROMUCOSAL | Status: AC
  Filled 2020-07-21: qty 15

## 2020-07-21 MED ORDER — MIDAZOLAM HCL 5 MG/5ML IJ SOLN
INTRAMUSCULAR | Status: AC
  Filled 2020-07-21: qty 10

## 2020-07-21 MED ORDER — MEPERIDINE HCL 50 MG/ML IJ SOLN
INTRAMUSCULAR | Status: DC | PRN
Start: 2020-07-21 — End: 2020-07-21
  Administered 2020-07-21 (×2): 25 mg via INTRAVENOUS

## 2020-07-21 MED ORDER — STERILE WATER FOR IRRIGATION IR SOLN
Status: DC | PRN
  Administered 2020-07-21: 1.5 mL

## 2020-07-21 NOTE — Op Note (Addendum)
[facility] Patient Name: Francisco Butler Procedure Date: 07/21/2020 8:56 AM MRN: [id number] Date of Birth: 1946/01/02 Attending MD: Hildred Laser , MD CSN: [id number] Age: 74 Admit Type: Outpatient Procedure:                Upper GI endoscopy Indications:              Esophageal dysphagia, Gastro-esophageal reflux                            disease Providers:                Hildred Laser, MD, Otis Peak B. Sharon Seller, RN, Caprice Kluver, Raphael Gibney, Technician Referring MD:             Ray Church, FNP Medicines:                Lidocaine spray, Meperidine 50 mg IV, Midazolam 5                            mg IV Complications:            No immediate complications. Estimated Blood Loss:     Estimated blood loss was minimal. Procedure:                Pre-Anesthesia Assessment:                           - Prior to the procedure, a History and Physical                            was performed, and patient medications and                            allergies were reviewed. The patient's tolerance of                            previous anesthesia was also reviewed. The risks                            and benefits of the procedure and the sedation                            options and risks were discussed with the patient.                            All questions were answered, and informed consent                            was obtained. Prior Anticoagulants: The patient has                            taken no previous anticoagulant or antiplatelet  agents except for NSAID medication. ASA Grade                            Assessment: II - A patient with mild systemic                            disease. After reviewing the risks and benefits,                            the patient was deemed in satisfactory condition to                            undergo the procedure.                           After obtaining informed consent, the  endoscope was                            passed under direct vision. Throughout the                            procedure, the patient's blood pressure, pulse, and                            oxygen saturations were monitored continuously. The                            [id number]-H190 ([id number]) scope was introduced through the                            mouth, and advanced to the second part of duodenum.                            The upper GI endoscopy was accomplished without                            difficulty. The patient tolerated the procedure                            well. Scope In: 9:35:21 AM Scope Out: 9:48:06 AM Total Procedure Duration: 0 hours 12 minutes 45 seconds  Findings:      The hypopharynx was normal.      The examined esophagus was normal.      One benign-appearing, intrinsic mild stenosis was found 38 cm from the       incisors. The stenosis was traversed. The dilation site was examined       following endoscope reinsertion and showed no change and no bleeding,       mucosal tear or perforation.      The Z-line was irregular and was found 38 cm from the incisors. Biopsies       were taken with a cold forceps for histology.      A 2 cm hiatal hernia was present.      The entire examined stomach was normal.      Localized mild inflammation characterized  by congestion (edema) and       erythema was found in the duodenal bulb.      The second portion of the duodenum was normal. Impression:               - Normal hypopharynx.                           - Normal esophagus.                           - Benign-appearing esophageal stenosis.                           - Z-line irregular, 38 cm from the incisors.                            Biopsied.                           - 2 cm hiatal hernia.                           - Normal stomach.                           - Duodenitis. focal secondary to NSAID use. Gastric                            biopsy negative for H.Pylori in  01/2019.                           - Normal second portion of the duodenum. Moderate Sedation:      Moderate (conscious) sedation was administered by the endoscopy nurse       and supervised by the endoscopist. The following parameters were       monitored: oxygen saturation, heart rate, blood pressure, CO2       capnography and response to care. Total physician intraservice time was       19 minutes. Recommendation:           - Patient has a contact number available for                            emergencies. The signs and symptoms of potential                            delayed complications were discussed with the                            patient. Return to normal activities tomorrow.                            Written discharge instructions were provided to the                            patient.                           -  Resume previous diet today.                           - Continue present medications.                           - No aspirin, ibuprofen, naproxen, or other                            non-steroidal anti-inflammatory drugs for 1 day.                           - Await pathology results.                           - Telephone GI clinic in 1 week. Procedure Code(s):        --- Professional ---                           [phone], Esophagogastroduodenoscopy, flexible,                            transoral; with biopsy, single or multiple                           G0500, Moderate sedation services provided by the                            same physician or other qualified health care                            professional performing a gastrointestinal                            endoscopic service that sedation supports,                            requiring the presence of an independent trained                            observer to assist in the monitoring of the                            patient's level of consciousness and physiological                             status; initial 15 minutes of intra-service time;                            patient age 60 years or older (additional time may                            be reported with [phone], as appropriate) Diagnosis Code(s):        --- Professional ---  K22.8, Other specified diseases of esophagus                           K22.2, Esophageal obstruction                           K44.9, Diaphragmatic hernia without obstruction or                            gangrene                           K29.80, Duodenitis without bleeding                           R13.14, Dysphagia, pharyngoesophageal phase                           K21.9, Gastro-esophageal reflux disease without                            esophagitis CPT copyright 2019 American Medical Association. All rights reserved. The codes documented in this report are preliminary and upon coder review may  be revised to meet current compliance requirements. Hildred Laser, MD Hildred Laser, MD 07/21/2020 9:59:25 AM This report has been signed electronically. Number of Addenda: 1 Addendum Number: 1   Addendum Date: 07/26/2020 4:13:02 PM      Esophageal dilation performed by passing 54 Fr. Maloney dilator. Hildred Laser, MD Hildred Laser, MD 07/26/2020 4:13:48 PM This report has been signed electronically.

## 2020-07-21 NOTE — H&P (Signed)
Francisco Butler is an 73 y.o. male.   Chief Complaint: Patient is here for esophagogastroduodenoscopy with esophageal dilation. HPI: Patient is 74 year old Caucasian male who has chronic GERD complicated by distal esophageal stricture which has been dilated few times most recently in February 2020.  He presents with dysphagia to solids primarily of cereal and occasionally to meat if he does not chew well for the last few months.  He says heartburn is well controlled with therapy.  He has lost 40 pounds this year voluntarily.  He does not take aspirin or anticoagulants.   Past Medical History:  Diagnosis Date  . Abdominal pain, chronic, epigastric 08/07/2017  . Anxiety   . Arthritis   . COPD (chronic obstructive pulmonary disease) ([place])   . GERD (gastroesophageal reflux disease)   . H/O hiatal hernia   . High cholesterol   . Hypertension   . Hypothyroidism   . Seasonal allergies     Past Surgical History:  Procedure Laterality Date  . ANTERIOR CERVICAL DECOMP/DISCECTOMY FUSION N/A 01/05/2014   Procedure: Cervical five-six, Cervical six-seven, Cervical seven-thoracic one anterior cervical decompression with fusion interbody prosthesis plating and bonegraft;  Surgeon: Erline Levine, MD;  Location: [place] [facility];  Service: Neurosurgery;  Laterality: N/A;  Cervical five-six, Cervical six-seven, Cervical seven-thoracic one anterior cervical decompression with fusion interbody prosthesis plating  . BIOPSY  01/12/2019   Procedure: BIOPSY;  Surgeon: Rogene Houston, MD;  [facility];  Service: Endoscopy;;  gastric ulcer  . CHOLECYSTECTOMY N/A 08/23/2017   Procedure: LAPAROSCOPIC CHOLECYSTECTOMY;  Surgeon: Aviva Signs, MD;  Location: [facility];  Service: General;  Laterality: N/A;  . ESOPHAGEAL DILATION N/A 01/12/2019   Procedure: ESOPHAGEAL DILATION;  Surgeon: Rogene Houston, MD;  [facility];  Service: Endoscopy;  Laterality: N/A;  . ESOPHAGOGASTRODUODENOSCOPY N/A 01/12/2019    Procedure: ESOPHAGOGASTRODUODENOSCOPY (EGD);  Surgeon: Rogene Houston, MD;  [facility];  Service: Endoscopy;  Laterality: N/A;  12:00  . EYE SURGERY Bilateral    lazy eye surgery  . FRACTURE SURGERY Right    foot  . FRACTURE SURGERY Right    collar bone  . HERNIA REPAIR     umbilical hernia repair  . KNEE SURGERY    . SHOULDER ARTHROSCOPY Right   . TONSILLECTOMY      History reviewed. No pertinent family history. Social History:  reports that he quit smoking about 6 years ago. His smoking use included cigarettes. He has a 40.00 pack-year smoking history. He has never used smokeless tobacco. He reports current alcohol use. He reports that he does not use drugs.  Allergies:  Allergies  Allergen Reactions  . Atorvastatin     Elevated heart rate  . Biaxin [Clarithromycin] Other (See Comments)    hiccups   . Meloxicam Other (See Comments)    ulcers    Medications Prior to Admission  Medication Sig Dispense Refill  . Cholecalciferol (VITAMIN D) 125 MCG (5000 UT) CAPS Take 5,000 Units by mouth daily.     . Cyanocobalamin (VITAMIN B-12) 5000 MCG SUBL Place 5,000 mcg under the tongue daily.    . diclofenac sodium (VOLTAREN) 1 % GEL Apply 1 application topically 3 (three) times daily as needed (pain).    . ferrous sulfate 325 (65 FE) MG tablet Take 325 mg by mouth daily with breakfast.    . levothyroxine (SYNTHROID) 125 MCG tablet Take 125 mcg by mouth daily before breakfast.     . lisinopril (ZESTRIL) 30 MG  tablet Take 30 mg by mouth daily.    . montelukast (SINGULAIR) 10 MG tablet Take 10 mg by mouth daily.    [name] omeprazole (PRILOSEC) 20 MG capsule Take 1 capsule (20 mg total) by mouth daily before breakfast. 60 capsule 3  . simvastatin (ZOCOR) 40 MG tablet Take 40 mg by mouth daily.     . nabumetone (RELAFEN) 750 MG tablet Take 750 mg by mouth daily as needed for mild pain.       Results for orders placed or performed during the hospital encounter of 07/19/20 (from  the past 48 hour(s))  SARS CORONAVIRUS 2 (TAT 6-24 HRS) Nasopharyngeal Nasopharyngeal Swab     Status: None   Collection Time: 07/19/20  1:00 PM   Specimen: Nasopharyngeal Swab  Result Value Ref Range   SARS Coronavirus 2 NEGATIVE NEGATIVE    Comment: (NOTE) SARS-CoV-2 target nucleic acids are NOT DETECTED.  The SARS-CoV-2 RNA is generally detectable in upper and lower respiratory specimens during the acute phase of infection. Negative results do not preclude SARS-CoV-2 infection, do not rule out co-infections with other pathogens, and should not be used as the sole basis for treatment or other patient management decisions. Negative results must be combined with clinical observations, patient history, and epidemiological information. The expected result is Negative.  Fact Sheet for Patients: SugarRoll.be  Fact Sheet for Healthcare Providers: https://www.woods-mathews.com/  This test is not yet approved or cleared by the [place] FDA and  has been authorized for detection and/or diagnosis of SARS-CoV-2 by FDA under an Emergency Use Authorization (EUA). This EUA will remain  in effect (meaning this test can be used) for the duration of the COVID-19 declaration under Se ction 564(b)(1) of the Act, 21 U.S.C. section 360bbb-3(b)(1), unless the authorization is terminated or revoked sooner.  Performed at [facility], [place] [address] [place], [place] [address]    No results found.  Review of Systems  Blood pressure (!) 147/73, pulse 66, temperature 98.5 F (36.9 C), temperature source Oral, resp. rate (!) 21, height 5\' 10"  (1.778 m), weight 80.7 kg, SpO2 98 %. Physical Exam HENT:     Mouth/Throat:     Mouth: Mucous membranes are moist.     Pharynx: Oropharynx is clear.  Eyes:     General: No scleral icterus.    Conjunctiva/sclera: Conjunctivae normal.  Cardiovascular:     Rate and Rhythm: Normal rate and regular rhythm.      Heart sounds: Normal heart sounds. No murmur heard.   Pulmonary:     Effort: Pulmonary effort is normal.     Breath sounds: Normal breath sounds.  Abdominal:     Comments: Abdomen is symmetrical soft and nontender with organomegaly or masses  Musculoskeletal:     Cervical back: Neck supple.  Lymphadenopathy:     Cervical: No cervical adenopathy.  Skin:    General: Skin is warm and dry.  Neurological:     Mental Status: He is alert.      Assessment/Plan Esophageal dysphagia in a patient with known esophageal stricture secondary to GERD. Esophagogastroduodenoscopy with esophageal dilation.  Hildred Laser, MD 07/21/2020, 9:25 AM

## 2020-07-21 NOTE — Discharge Instructions (Signed)
No aspirin or NSAIDs for 24 hours. Take Relafen/nabumetone with food or snack and not empty stomach. Resume other medications as before. Resume usual diet. No driving for 24 hours. Physician will call with biopsy results.  PATIENT INSTRUCTIONS POST-ANESTHESIA  IMMEDIATELY FOLLOWING SURGERY:  Do not drive or operate machinery for the first twenty four hours after surgery.  Do not make any important decisions for twenty four hours after surgery or while taking narcotic pain medications or sedatives.  If you develop intractable nausea and vomiting or a severe headache please notify your doctor immediately.  FOLLOW-UP:  Please make an appointment with your surgeon as instructed. You do not need to follow up with anesthesia unless specifically instructed to do so.  WOUND CARE INSTRUCTIONS (if applicable):  Keep a dry clean dressing on the anesthesia/puncture wound site if there is drainage.  Once the wound has quit draining you may leave it open to air.  Generally you should leave the bandage intact for twenty four hours unless there is drainage.  If the epidural site drains for more than 36-48 hours please call the anesthesia department.  QUESTIONS?:  Please feel free to call your physician or the hospital operator if you have any questions, and they will be happy to assist you.      Upper Endoscopy, Adult, Care After This sheet gives you information about how to care for yourself after your procedure. Your health care provider may also give you more specific instructions. If you have problems or questions, contact your health care provider. What can I expect after the procedure? After the procedure, it is common to have:  A sore throat.  Mild stomach pain or discomfort.  Bloating.  Nausea. Follow these instructions at home:   Follow instructions from your health care provider about what to eat or drink after your procedure.  Return to your normal activities as told by your health  care provider. Ask your health care provider what activities are safe for you.  Take over-the-counter and prescription medicines only as told by your health care provider.  Do not drive for 24 hours if you were given a sedative during your procedure.  Keep all follow-up visits as told by your health care provider. This is important. Contact a health care provider if you have:  A sore throat that lasts longer than one day.  Trouble swallowing. Get help right away if:  You vomit blood or your vomit looks like coffee grounds.  You have: ? A fever. ? Bloody, black, or tarry stools. ? A severe sore throat or you cannot swallow. ? Difficulty breathing. ? Severe pain in your chest or abdomen. Summary  After the procedure, it is common to have a sore throat, mild stomach discomfort, bloating, and nausea.  Do not drive for 24 hours if you were given a sedative during the procedure.  Follow instructions from your health care provider about what to eat or drink after your procedure.  Return to your normal activities as told by your health care provider. This information is not intended to replace advice given to you by your health care provider. Make sure you discuss any questions you have with your health care provider. Document Revised: 05/13/2018 Document Reviewed: 04/21/2018 Elsevier Patient Education  [place].   Esophageal Dilatation Esophageal dilatation, also called esophageal dilation, is a procedure to widen or open (dilate) a blocked or narrowed part of the esophagus. The esophagus is the part of the body that moves food and  liquid from the mouth to the stomach. You may need this procedure if:  You have a buildup of scar tissue in your esophagus that makes it difficult, painful, or impossible to swallow. This can be caused by gastroesophageal reflux disease (GERD).  You have cancer of the esophagus.  There is a problem with how food moves through your  esophagus. In some cases, you may need this procedure repeated at a later time to dilate the esophagus gradually. Tell a health care provider about:  Any allergies you have.  All medicines you are taking, including vitamins, herbs, eye drops, creams, and over-the-counter medicines.  Any problems you or family members have had with anesthetic medicines.  Any blood disorders you have.  Any surgeries you have had.  Any medical conditions you have.  Any antibiotic medicines you are required to take before dental procedures.  Whether you are pregnant or may be pregnant. What are the risks? Generally, this is a safe procedure. However, problems may occur, including:  Bleeding due to a tear in the lining of the esophagus.  A hole (perforation) in the esophagus. What happens before the procedure?  Follow instructions from your health care provider about eating or drinking restrictions.  Ask your health care provider about changing or stopping your regular medicines. This is especially important if you are taking diabetes medicines or blood thinners.  Plan to have someone take you home from the hospital or clinic.  Plan to have a responsible adult care for you for at least 24 hours after you leave the hospital or clinic. This is important. What happens during the procedure?  You may be given a medicine to help you relax (sedative).  A numbing medicine may be sprayed into the back of your throat, or you may gargle the medicine.  Your health care provider may perform the dilatation using various surgical instruments, such as: ? Simple dilators. This instrument is carefully placed in the esophagus to stretch it. ? Guided wire bougies. This involves using an endoscope to insert a wire into the esophagus. A dilator is passed over this wire to enlarge the esophagus. Then the wire is removed. ? Balloon dilators. An endoscope with a small balloon at the end is inserted into the esophagus.  The balloon is inflated to stretch the esophagus and open it up. The procedure may vary among health care providers and hospitals. What happens after the procedure?  Your blood pressure, heart rate, breathing rate, and blood oxygen level will be monitored until the medicines you were given have worn off.  Your throat may feel slightly sore and numb. This will improve slowly over time.  You will not be allowed to eat or drink until your throat is no longer numb.  When you are able to drink, urinate, and sit on the edge of the bed without nausea or dizziness, you may be able to return home. Follow these instructions at home:  Take over-the-counter and prescription medicines only as told by your health care provider.  Do not drive for 24 hours if you were given a sedative during your procedure.  You should have a responsible adult with you for 24 hours after the procedure.  Follow instructions from your health care provider about any eating or drinking restrictions.  Do not use any products that contain nicotine or tobacco, such as cigarettes and e-cigarettes. If you need help quitting, ask your health care provider.  Keep all follow-up visits as told by your health care provider.  This is important. Get help right away if you:  Have a fever.  Have chest pain.  Have pain that is not relieved by medication.  Have trouble breathing.  Have trouble swallowing.  Vomit blood. Summary  Esophageal dilatation, also called esophageal dilation, is a procedure to widen or open (dilate) a blocked or narrowed part of the esophagus.  Plan to have someone take you home from the hospital or clinic.  For this procedure, a numbing medicine may be sprayed into the back of your throat, or you may gargle the medicine.  Do not drive for 24 hours if you were given a sedative during your procedure. This information is not intended to replace advice given to you by your health care provider. Make  sure you discuss any questions you have with your health care provider. Document Revised: 09/16/2019 Document Reviewed: 09/24/2017 Elsevier Patient Education  2020 [place].   Hiatal Hernia  A hiatal hernia occurs when part of the stomach slides above the muscle that separates the abdomen from the chest (diaphragm). A person can be born with a hiatal hernia (congenital), or it may develop over time. In almost all cases of hiatal hernia, only the top part of the stomach pushes through the diaphragm. Many people have a hiatal hernia with no symptoms. The larger the hernia, the more likely it is that you will have symptoms. In some cases, a hiatal hernia allows stomach acid to flow back into the tube that carries food from your mouth to your stomach (esophagus). This may cause heartburn symptoms. Severe heartburn symptoms may mean that you have developed a condition called gastroesophageal reflux disease (GERD). What are the causes? This condition is caused by a weakness in the opening (hiatus) where the esophagus passes through the diaphragm to attach to the upper part of the stomach. A person may be born with a weakness in the hiatus, or a weakness can develop over time. What increases the risk? This condition is more likely to develop in:  Older people. Age is a major risk factor for a hiatal hernia, especially if you are over the age of 76.  Pregnant women.  People who are overweight.  People who have frequent constipation. What are the signs or symptoms? Symptoms of this condition usually develop in the form of GERD symptoms. Symptoms include:  Heartburn.  Belching.  Indigestion.  Trouble swallowing.  Coughing or wheezing.  Sore throat.  Hoarseness.  Chest pain.  Nausea and vomiting. How is this diagnosed? This condition may be diagnosed during testing for GERD. Tests that may be done include:  X-rays of your stomach or chest.  An upper gastrointestinal (GI)  series. This is an X-ray exam of your GI tract that is taken after you swallow a chalky liquid that shows up clearly on the X-ray.  Endoscopy. This is a procedure to look into your stomach using a thin, flexible tube that has a tiny camera and light on the end of it. How is this treated? This condition may be treated by:  Dietary and lifestyle changes to help reduce GERD symptoms.  Medicines. These may include: ? Over-the-counter antacids. ? Medicines that make your stomach empty more quickly. ? Medicines that block the production of stomach acid (H2 blockers). ? Stronger medicines to reduce stomach acid (proton pump inhibitors).  Surgery to repair the hernia, if other treatments are not helping. If you have no symptoms, you may not need treatment. Follow these instructions at home: Lifestyle and activity  Do not use any products that contain nicotine or tobacco, such as cigarettes and e-cigarettes. If you need help quitting, ask your health care provider.  Try to achieve and maintain a healthy body weight.  Avoid putting pressure on your abdomen. Anything that puts pressure on your abdomen increases the amount of acid that may be pushed up into your esophagus. ? Avoid bending over, especially after eating. ? Raise the head of your bed by putting blocks under the legs. This keeps your head and esophagus higher than your stomach. ? Do not wear tight clothing around your chest or stomach. ? Try not to strain when having a bowel movement, when urinating, or when lifting heavy objects. Eating and drinking  Avoid foods that can worsen GERD symptoms. These may include: ? Fatty foods, like fried foods. ? Citrus fruits, like oranges or lemon. ? Other foods and drinks that contain acid, like orange juice or tomatoes. ? Spicy food. ? Chocolate.  Eat frequent small meals instead of three large meals a day. This helps prevent your stomach from getting too full. ? Eat slowly. ? Do not lie  down right after eating. ? Do not eat 1-2 hours before bed.  Do not drink beverages with caffeine. These include cola, coffee, cocoa, and tea.  Do not drink alcohol. General instructions  Take over-the-counter and prescription medicines only as told by your health care provider.  Keep all follow-up visits as told by your health care provider. This is important. Contact a health care provider if:  Your symptoms are not controlled with medicines or lifestyle changes.  You are having trouble swallowing.  You have coughing or wheezing that will not go away. Get help right away if:  Your pain is getting worse.  Your pain spreads to your arms, neck, jaw, teeth, or back.  You have shortness of breath.  You sweat for no reason.  You feel sick to your stomach (nauseous) or you vomit.  You vomit blood.  You have bright red blood in your stools.  You have black, tarry stools. This information is not intended to replace advice given to you by your health care provider. Make sure you discuss any questions you have with your health care provider. Document Revised: 11/01/2017 Document Reviewed: 06/24/2017 Elsevier Patient Education  [place].

## 2020-07-22 LAB — SURGICAL PATHOLOGY

## 2020-07-25 ENCOUNTER — Encounter (HOSPITAL_COMMUNITY): Payer: Self-pay | Admitting: Internal Medicine

## 2020-07-26 ENCOUNTER — Telehealth (INDEPENDENT_AMBULATORY_CARE_PROVIDER_SITE_OTHER): Payer: Self-pay | Admitting: Internal Medicine

## 2020-08-18 ENCOUNTER — Other Ambulatory Visit (INDEPENDENT_AMBULATORY_CARE_PROVIDER_SITE_OTHER): Payer: Self-pay | Admitting: Internal Medicine

## 2020-10-04 ENCOUNTER — Ambulatory Visit (INDEPENDENT_AMBULATORY_CARE_PROVIDER_SITE_OTHER): Payer: Medicare Other | Admitting: Internal Medicine

## 2021-08-10 ENCOUNTER — Ambulatory Visit (INDEPENDENT_AMBULATORY_CARE_PROVIDER_SITE_OTHER): Payer: Medicare Other | Admitting: Gastroenterology

## 2021-11-16 ENCOUNTER — Encounter (INDEPENDENT_AMBULATORY_CARE_PROVIDER_SITE_OTHER): Payer: Self-pay | Admitting: Gastroenterology

## 2021-11-16 ENCOUNTER — Ambulatory Visit (INDEPENDENT_AMBULATORY_CARE_PROVIDER_SITE_OTHER): Payer: Medicare Other | Admitting: Gastroenterology

## 2021-11-16 ENCOUNTER — Telehealth (INDEPENDENT_AMBULATORY_CARE_PROVIDER_SITE_OTHER): Payer: Self-pay

## 2021-11-16 ENCOUNTER — Encounter (INDEPENDENT_AMBULATORY_CARE_PROVIDER_SITE_OTHER): Payer: Self-pay

## 2021-11-16 ENCOUNTER — Other Ambulatory Visit: Payer: Self-pay

## 2021-11-16 ENCOUNTER — Other Ambulatory Visit (INDEPENDENT_AMBULATORY_CARE_PROVIDER_SITE_OTHER): Payer: Self-pay

## 2021-11-16 VITALS — BP 131/85 | HR 74 | Temp 97.6°F | Ht 70.0 in | Wt 195.6 lb

## 2021-11-16 DIAGNOSIS — K2100 Gastro-esophageal reflux disease with esophagitis, without bleeding: Secondary | ICD-10-CM

## 2021-11-16 DIAGNOSIS — K222 Esophageal obstruction: Secondary | ICD-10-CM

## 2021-11-16 MED ORDER — PEG 3350-KCL-NA BICARB-NACL 420 G PO SOLR: 4000.0000 mL | ORAL | 0 refills | Status: DC

## 2021-11-16 NOTE — Progress Notes (Signed)
Maylon Peppers, M.D. Gastroenterology & Hepatology [facility] [address] [place], [place] [address]  Primary Care Physician: Ray Church, FNP [address] [place] [address]  I will communicate my assessment and recommendations to the referring MD via EMR.  Problems: GERD Peptic esophageal stricture  History of Present Illness: Francisco Butler is a 75 y.o. 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  The patient was last seen on 06/15/2020. At that time, the patient was scheduled to undergo an EGD with findings described below.  Patient reports that he has felt fine. Denies having any complaints. States he does not have any heartburn. States he has been cooking his own meals as he wants to avoid food that triggers his heartburn. He takes omeprazole 20 mg every day, sometimes he skips a few doses but tries to be as compliant as possible as it completely suppresses his heartburn episodes. No dysphagia or odynophagia. Tries to avoid drinking coffee so he avoids heartburn episodes.  The patient denies having any nausea, vomiting, fever, chills, hematochezia, melena, hematemesis, abdominal distention, abdominal pain, diarrhea, jaundice, pruritus or weight loss.  Last EGD: 07/21/2020, there was presence of intrinsic mild stenosis at 38 cm from the incisors which was traversed, there was no presence of any mucosal tear or any alterations.  The Z-line was irregular..  Biopsies from esophagus did not show any alterations besides carditis.A 2 cm hiatal hernia was found.  Normal stomach.  There was erythema and congestion in the duodenal bulb.  Patient advised to avoid NSAIDs  Last Colonoscopy: 08/20/2013, there were 2 polyps in the right side of the colon, 1 polyp had 5 mm and the second 1 had 1 cm in size  which were retrieved with snare cautery.  Diverticulosis.  Pathology was consistent with hyperplastic polyps.  Patient advised to repeat in 3 years.  Past Medical History: Past Medical History:  Diagnosis Date   Abdominal pain, chronic, epigastric 08/07/2017   Anxiety    Arthritis    COPD (chronic obstructive pulmonary disease) (HCC)    GERD (gastroesophageal reflux disease)    H/O hiatal hernia    High cholesterol    Hypertension    Hypothyroidism    Seasonal allergies     Past Surgical History: Past Surgical History:  Procedure Laterality Date   ANTERIOR CERVICAL DECOMP/DISCECTOMY FUSION N/A 01/05/2014   Procedure: Cervical five-six, Cervical six-seven, Cervical seven-thoracic one anterior cervical decompression with fusion interbody prosthesis plating and bonegraft;  Surgeon: Erline Levine, MD;  Location: [facility];  Service: Neurosurgery;  Laterality: N/A;  Cervical five-six, Cervical six-seven, Cervical seven-thoracic one anterior cervical decompression with fusion interbody prosthesis plating   BIOPSY  01/12/2019   Procedure: BIOPSY;  Surgeon: Rogene Houston, MD;  [facility];  Service: Endoscopy;;  gastric ulcer   BIOPSY  07/21/2020   Procedure: BIOPSY;  Surgeon: Rogene Houston, MD;  [facility];  Service: Endoscopy;;  esophagus   CHOLECYSTECTOMY N/A 08/23/2017   Procedure: LAPAROSCOPIC CHOLECYSTECTOMY;  Surgeon: Aviva Signs, MD;  Location: [facility];  Service: General;  Laterality: N/A;   ESOPHAGEAL DILATION N/A 01/12/2019   Procedure: ESOPHAGEAL DILATION;  Surgeon: Rogene Houston, MD;  [facility];  Service: Endoscopy;  Laterality: N/A;   ESOPHAGEAL DILATION N/A 07/21/2020   Procedure: ESOPHAGEAL DILATION;  Surgeon: Rogene Houston, MD;  [facility];  Service: Endoscopy;  Laterality: N/A;   ESOPHAGOGASTRODUODENOSCOPY N/A 01/12/2019   Procedure: ESOPHAGOGASTRODUODENOSCOPY (EGD);  Surgeon: Rogene Houston, MD;  [facility];  Service: Endoscopy;  Laterality: N/A;  12:00   ESOPHAGOGASTRODUODENOSCOPY N/A 07/21/2020   Procedure: ESOPHAGOGASTRODUODENOSCOPY (EGD);  Surgeon: Rogene Houston, MD;  [facility];  Service: Endoscopy;  Laterality: N/A;  210   EYE SURGERY Bilateral    lazy eye surgery   FRACTURE SURGERY Right    foot   FRACTURE SURGERY Right    collar bone   HERNIA REPAIR     umbilical hernia repair   KNEE SURGERY     SHOULDER ARTHROSCOPY Right    TONSILLECTOMY      Family History:History reviewed. No pertinent family history.  Social History: Social History   Tobacco Use  Smoking Status Former   Packs/day: 1.00   Years: 40.00   Pack years: 40.00   Types: Cigarettes   Quit date: 11/22/2013   Years since quitting: 7.9  Smokeless Tobacco Never   Social History   Substance and Sexual Activity  Alcohol Use Yes   Comment: social   Social History   Substance and Sexual Activity  Drug Use No    Allergies: Allergies  Allergen Reactions   Atorvastatin     Elevated heart rate   Biaxin [Clarithromycin] Other (See Comments)    hiccups    Meloxicam Other (See Comments)    ulcers    Medications: Current Outpatient Medications  Medication Sig Dispense Refill   Cholecalciferol (VITAMIN D) 125 MCG (5000 UT) CAPS Take 5,000 Units by mouth daily.      Cyanocobalamin (VITAMIN B-12 PO) Take by mouth.     diclofenac sodium (VOLTAREN) 1 % GEL Apply 1 application topically 3 (three) times daily as needed (pain).     ferrous sulfate 325 (65 FE) MG tablet Take 325 mg by mouth daily with breakfast.     levothyroxine (SYNTHROID) 125 MCG tablet Take 125 mcg by mouth daily before breakfast.      lisinopril (ZESTRIL) 30 MG tablet Take 30 mg by mouth daily.     montelukast (SINGULAIR) 10 MG tablet Take 10 mg by mouth daily.     nabumetone (RELAFEN) 750 MG tablet Take 750 mg by mouth daily as needed for mild pain.      omeprazole (PRILOSEC) 20 MG capsule TAKE 1 CAPSULE BY MOUTH  BEFORE BREAKFAST 60 capsule 0   simvastatin (ZOCOR) 40 MG tablet Take 40 mg by mouth daily.      No current facility-administered medications for this visit.    Review of Systems: GENERAL: negative for malaise, night sweats HEENT: No changes in hearing or vision, no nose bleeds or other nasal problems. NECK: Negative for lumps, goiter, pain and significant neck swelling RESPIRATORY: Negative for cough, wheezing CARDIOVASCULAR: Negative for chest pain, leg swelling, palpitations, orthopnea GI: SEE HPI MUSCULOSKELETAL: Negative for joint pain or swelling, back pain, and muscle pain. SKIN: Negative for lesions, rash PSYCH: Negative for sleep disturbance, mood disorder and recent psychosocial stressors. HEMATOLOGY Negative for prolonged bleeding, bruising easily, and swollen nodes. ENDOCRINE: Negative for cold or heat intolerance, polyuria, polydipsia and goiter. NEURO: negative for tremor, gait imbalance, syncope and seizures. The remainder of the review of systems is noncontributory.   Physical Exam: BP 131/85 (BP Location: Right Arm, Patient Position: Sitting, Cuff Size: Large)    Pulse 74    Temp 97.6 F (36.4 C) (Oral)  Ht 5\' 10"  (1.778 m)    Wt 195 lb 9.6 oz (88.7 kg)    BMI 28.07 kg/m  GENERAL: The patient is AO x3, in no acute distress. HEENT: Head is normocephalic and atraumatic. EOMI are intact. Mouth is well hydrated and without lesions. NECK: Supple. No masses LUNGS: Clear to auscultation. No presence of rhonchi/wheezing/rales. Adequate chest expansion HEART: RRR, normal s1 and s2. ABDOMEN: Soft, nontender, no guarding, no peritoneal signs, and nondistended. BS +. No masses. EXTREMITIES: Without any cyanosis, clubbing, rash, lesions or edema. NEUROLOGIC: AOx3, no focal motor deficit. SKIN: no jaundice, no rashes  Imaging/Labs: as above  I personally reviewed and interpreted the available labs, imaging and endoscopic files.  Impression and Plan: Francisco Butler is a  75 y.o. 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  Patient has presented improvement of his symptoms while taking his current dosage of omeprazole 20 mg every day.  Given the fact that he had a previous stricture, he should continue taking the current dose indefinitely, which she understood and agreed.  He had history of chronic adenomas, for which he is due for repeat colonoscopy.  We will set him up for this procedure.  - Continue omeprazole 20 mg qday - Schedule colonoscopy - Explained presumed etiology of reflux symptoms. Instruction provided in the use of antireflux medication - patient should take medication in the morning 30-45 minutes before eating breakfast. Discussed avoidance of eating within 2 hours of lying down to sleep and benefit of blocks to elevate head of bed. Also, will benefit from avoiding carbonated drinks/sodas or food that has tomatoes, spicy or greasy food.  All questions were answered.      Harvel Quale, MD Gastroenterology and Hepatology [facility]

## 2021-11-16 NOTE — Telephone Encounter (Signed)
Francisco Butler, CMA  

## 2021-11-16 NOTE — Patient Instructions (Addendum)
Continue omeprazole 20 mg qday Schedule colonoscopy Explained presumed etiology of reflux symptoms. Instruction provided in the use of antireflux medication - patient should take medication in the morning 30-45 minutes before eating breakfast. Discussed avoidance of eating within 2 hours of lying down to sleep and benefit of blocks to elevate head of bed. Also, will benefit from avoiding carbonated drinks/sodas or food that has tomatoes, spicy or greasy food.

## 2021-11-17 ENCOUNTER — Encounter (INDEPENDENT_AMBULATORY_CARE_PROVIDER_SITE_OTHER): Payer: Self-pay

## 2021-12-07 ENCOUNTER — Other Ambulatory Visit (HOSPITAL_COMMUNITY): Payer: Medicare Other

## 2021-12-12 ENCOUNTER — Encounter (HOSPITAL_COMMUNITY): Payer: Self-pay

## 2021-12-12 ENCOUNTER — Ambulatory Visit (HOSPITAL_COMMUNITY): Admit: 2021-12-12 | Payer: Medicare Other | Admitting: Gastroenterology

## 2021-12-12 SURGERY — COLONOSCOPY WITH PROPOFOL
Anesthesia: Monitor Anesthesia Care

## 2022-01-08 ENCOUNTER — Telehealth (INDEPENDENT_AMBULATORY_CARE_PROVIDER_SITE_OTHER): Payer: Self-pay | Admitting: *Deleted

## 2022-01-08 NOTE — Telephone Encounter (Signed)
[phone] had apt for [facility] had to cancel because of covid wants something week of April 9th

## 2022-01-10 ENCOUNTER — Other Ambulatory Visit (INDEPENDENT_AMBULATORY_CARE_PROVIDER_SITE_OTHER): Payer: Self-pay

## 2022-03-01 ENCOUNTER — Other Ambulatory Visit (INDEPENDENT_AMBULATORY_CARE_PROVIDER_SITE_OTHER): Payer: Self-pay

## 2022-03-01 DIAGNOSIS — Z1211 Encounter for screening for malignant neoplasm of colon: Secondary | ICD-10-CM

## 2022-03-14 ENCOUNTER — Encounter (INDEPENDENT_AMBULATORY_CARE_PROVIDER_SITE_OTHER): Payer: Self-pay | Admitting: *Deleted

## 2022-03-14 ENCOUNTER — Other Ambulatory Visit: Payer: Self-pay

## 2022-03-14 ENCOUNTER — Ambulatory Visit (HOSPITAL_BASED_OUTPATIENT_CLINIC_OR_DEPARTMENT_OTHER): Payer: Medicare Other | Admitting: Anesthesiology

## 2022-03-14 ENCOUNTER — Encounter (HOSPITAL_COMMUNITY)
Admission: RE | Disposition: A | Discharge status: Home / Self Care | Payer: Self-pay | Source: Ambulatory Visit | Attending: Gastroenterology

## 2022-03-14 ENCOUNTER — Encounter (HOSPITAL_COMMUNITY): Payer: Self-pay | Admitting: Gastroenterology

## 2022-03-14 ENCOUNTER — Ambulatory Visit (HOSPITAL_COMMUNITY): Payer: Medicare Other | Admitting: Anesthesiology

## 2022-03-14 ENCOUNTER — Ambulatory Visit (HOSPITAL_COMMUNITY)
Admission: RE | Admit: 2022-03-14 | Discharge: 2022-03-14 | Disposition: A | Discharge status: Home / Self Care | Payer: Medicare Other | Source: Ambulatory Visit | Attending: Gastroenterology | Admitting: Gastroenterology

## 2022-03-14 DIAGNOSIS — K219 Gastro-esophageal reflux disease without esophagitis: Secondary | ICD-10-CM | POA: Insufficient documentation

## 2022-03-14 DIAGNOSIS — Z86010 Personal history of colonic polyps: Secondary | ICD-10-CM

## 2022-03-14 DIAGNOSIS — K635 Polyp of colon: Secondary | ICD-10-CM

## 2022-03-14 DIAGNOSIS — Z87891 Personal history of nicotine dependence: Secondary | ICD-10-CM | POA: Diagnosis not present

## 2022-03-14 DIAGNOSIS — Z79899 Other long term (current) drug therapy: Secondary | ICD-10-CM | POA: Insufficient documentation

## 2022-03-14 DIAGNOSIS — K648 Other hemorrhoids: Secondary | ICD-10-CM

## 2022-03-14 DIAGNOSIS — I10 Essential (primary) hypertension: Secondary | ICD-10-CM | POA: Insufficient documentation

## 2022-03-14 DIAGNOSIS — D120 Benign neoplasm of cecum: Secondary | ICD-10-CM | POA: Insufficient documentation

## 2022-03-14 DIAGNOSIS — J449 Chronic obstructive pulmonary disease, unspecified: Secondary | ICD-10-CM | POA: Diagnosis not present

## 2022-03-14 DIAGNOSIS — Z1211 Encounter for screening for malignant neoplasm of colon: Secondary | ICD-10-CM | POA: Insufficient documentation

## 2022-03-14 DIAGNOSIS — E785 Hyperlipidemia, unspecified: Secondary | ICD-10-CM | POA: Insufficient documentation

## 2022-03-14 DIAGNOSIS — E039 Hypothyroidism, unspecified: Secondary | ICD-10-CM | POA: Insufficient documentation

## 2022-03-14 DIAGNOSIS — Z09 Encounter for follow-up examination after completed treatment for conditions other than malignant neoplasm: Secondary | ICD-10-CM | POA: Diagnosis not present

## 2022-03-14 DIAGNOSIS — K5730 Diverticulosis of large intestine without perforation or abscess without bleeding: Secondary | ICD-10-CM

## 2022-03-14 HISTORY — PX: POLYPECTOMY: SHX5525

## 2022-03-14 HISTORY — PX: COLONOSCOPY WITH PROPOFOL: SHX5780

## 2022-03-14 LAB — HM COLONOSCOPY

## 2022-03-14 SURGERY — COLONOSCOPY WITH PROPOFOL
Anesthesia: General

## 2022-03-14 MED ORDER — PHENYLEPHRINE HCL (PRESSORS) 10 MG/ML IV SOLN
INTRAVENOUS | Status: DC | PRN
  Administered 2022-03-14 (×6): 100 ug via INTRAVENOUS

## 2022-03-14 MED ORDER — LACTATED RINGERS IV SOLN
INTRAVENOUS | Status: DC
  Administered 2022-03-14: 1000 mL via INTRAVENOUS

## 2022-03-14 MED ORDER — PHENYLEPHRINE 40 MCG/ML (10ML) SYRINGE FOR IV PUSH (FOR BLOOD PRESSURE SUPPORT)
PREFILLED_SYRINGE | INTRAVENOUS | Status: AC
Start: 2022-03-14 — End: ?
  Filled 2022-03-14: qty 20

## 2022-03-14 MED ORDER — PROPOFOL 1000 MG/100ML IV EMUL
INTRAVENOUS | Status: AC
  Filled 2022-03-14: qty 100

## 2022-03-14 MED ORDER — PROPOFOL 10 MG/ML IV BOLUS
INTRAVENOUS | Status: DC | PRN
  Administered 2022-03-14: 100 mg via INTRAVENOUS

## 2022-03-14 MED ORDER — PROPOFOL 500 MG/50ML IV EMUL
INTRAVENOUS | Status: DC | PRN
  Administered 2022-03-14: 150 ug/kg/min via INTRAVENOUS

## 2022-03-14 NOTE — Op Note (Signed)
[facility] ?Patient Name: Francisco Butler ?Procedure Date: 03/14/2022 7:58 AM ?MRN: [id number] ?Date of Birth: 20-Feb-1946 ?Attending MD: Maylon Peppers ,  ?CSN: [id number] ?Age: 76 ?Admit Type: Outpatient ?Procedure:                Colonoscopy ?Indications:              Surveillance: Personal history of adenomatous  ?                          polyps on last colonoscopy > 5 years ago ?Providers:                Maylon Peppers, Caprice Kluver, Aram Candela ?Referring MD:              ?Medicines:                Monitored Anesthesia Care ?Complications:            No immediate complications. ?Estimated Blood Loss:     Estimated blood loss: none. ?Procedure:                Pre-Anesthesia Assessment: ?                          - Prior to the procedure, a History and Physical  ?                          was performed, and patient medications, allergies  ?                          and sensitivities were reviewed. The patient's  ?                          tolerance of previous anesthesia was reviewed. ?                          - The risks and benefits of the procedure and the  ?                          sedation options and risks were discussed with the  ?                          patient. All questions were answered and informed  ?                          consent was obtained. ?                          After obtaining informed consent, the colonoscope  ?                          was passed under direct vision. Throughout the  ?                          procedure, the patient's blood pressure, pulse, and  ?                          oxygen saturations  were monitored continuously. The  ?                          PCF-HQ190L ([id number]) scope was introduced through  ?                          the anus and advanced to the the cecum, identified  ?                          by appendiceal orifice and ileocecal valve. The  ?                          colonoscopy was performed without difficulty. The  ?                           patient tolerated the procedure well. The quality  ?                          of the bowel preparation was adequate. ?Scope In: 8:37:57 AM ?Scope Out: 9:09:00 AM ?Scope Withdrawal Time: 0 hours 21 minutes 52 seconds  ?Total Procedure Duration: 0 hours 31 minutes 3 seconds  ?Findings: ?     The perianal and digital rectal examinations were normal. ?     Three sessile polyps were found in the transverse colon and cecum. The  ?     polyps were 3 to 8 mm in size. These polyps were removed with a cold  ?     snare. Resection and retrieval were complete. ?     A 1 mm polyp was found in the ascending colon. The polyp was sessile.  ?     The polyp was removed with a cold biopsy forceps. Resection and  ?     retrieval were complete. ?     A few small-mouthed diverticula were found in the transverse colon. ?     Non-bleeding internal hemorrhoids were found during retroflexion. The  ?     hemorrhoids were small. ?Impression:               - Three 3 to 8 mm polyps in the transverse colon  ?                          and in the cecum, removed with a cold snare.  ?                          Resected and retrieved. ?                          - One 1 mm polyp in the ascending colon, removed  ?                          with a cold biopsy forceps. Resected and retrieved. ?                          - Diverticulosis in the transverse colon. ?                          -  Non-bleeding internal hemorrhoids. ?Moderate Sedation: ?     Per Anesthesia Care ?Recommendation:           - Discharge patient to home (ambulatory). ?                          - Resume previous diet. ?                          - Await pathology results. ?                          - Repeat colonoscopy for surveillance based on  ?                          pathology results. ?Procedure Code(s):        --- Professional --- ?                          [phone], Colonoscopy, flexible; with removal of  ?                          tumor(s), polyp(s), or other lesion(s) by snare  ?                           technique ?                          45380, 59, Colonoscopy, flexible; with biopsy,  ?                          single or multiple ?Diagnosis Code(s):        --- Professional --- ?                          K63.5, Polyp of colon ?                          Z86.010, Personal history of colonic polyps ?                          K64.8, Other hemorrhoids ?                          K57.30, Diverticulosis of large intestine without  ?                          perforation or abscess without bleeding ?CPT copyright 2019 American Medical Association. All rights reserved. ?The codes documented in this report are preliminary and upon coder review may  ?be revised to meet current compliance requirements. ?Maylon Peppers, MD ?Maylon Peppers,  ?03/14/2022 9:15:44 AM ?This report has been signed electronically. ?Number of Addenda: 0 ?

## 2022-03-14 NOTE — Anesthesia Postprocedure Evaluation (Signed)
Anesthesia Post Note ? ?Patient: ORA [facility] ? ?Procedure(s) Performed: COLONOSCOPY WITH PROPOFOL ?POLYPECTOMY ? ?Patient location during evaluation: Endoscopy ?Anesthesia Type: General ?Level of consciousness: awake and alert and oriented ?Pain management: pain level controlled ?Vital Signs Assessment: post-procedure vital signs reviewed and stable ?Respiratory status: spontaneous breathing, nonlabored ventilation and respiratory function stable ?Cardiovascular status: blood pressure returned to baseline and stable ?Postop Assessment: no apparent nausea or vomiting ?Anesthetic complications: no ? ? ?No notable events documented. ? ? ?Last Vitals:  ?Vitals:  ? 03/14/22 0736 03/14/22 0913  ?BP: (!) 159/82 117/64  ?Pulse: 80   ?Resp: 17 16  ?Temp: 36.7 ?C 36.7 ?C  ?SpO2: 99% 94%  ?  ?Last Pain:  ?Vitals:  ? 03/14/22 0913  ?TempSrc: Oral  ?PainSc: 0-No pain  ? ? ?  ?  ?  ?  ?  ?  ? ?Roddy Bellamy C Eulogio Requena ? ? ? ? ?

## 2022-03-14 NOTE — Transfer of Care (Signed)
Immediate Anesthesia Transfer of Care Note ? ?Patient: Francisco Butler ? ?Procedure(s) Performed: COLONOSCOPY WITH PROPOFOL ?POLYPECTOMY ? ?Patient Location: PACU ? ?Anesthesia Type:General ? ?Level of Consciousness: awake, alert , oriented and patient cooperative ? ?Airway & Oxygen Therapy: Patient Spontanous Breathing ? ?Post-op Assessment: Report given to RN, Post -op Vital signs reviewed and stable and Patient moving all extremities X 4 ? ?Post vital signs: Reviewed and stable ? ?Last Vitals:  ?Vitals Value Taken Time  ?BP    ?Temp    ?Pulse    ?Resp    ?SpO2    ? ? ?Last Pain:  ?Vitals:  ? 03/14/22 0834  ?TempSrc:   ?PainSc: 0-No pain  ?   ? ?Patients Stated Pain Goal: 9 (03/14/22 0736) ? ?Complications: No notable events documented. ?

## 2022-03-14 NOTE — Discharge Instructions (Signed)
You are being discharged to home.  Resume your previous diet.  We are waiting for your pathology results.  Your physician has recommended a repeat colonoscopy for surveillance based on pathology results.  

## 2022-03-14 NOTE — H&P (Signed)
Francisco Butler is an 76 y.o. male.   ?Chief Complaint: history colonic polyps ?HPI: Francisco Butler is a 76 y.o. 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 ? ?Last colonoscopy was performed on 08/20/2013, there were 2 polyps in the right side of the colon, 1 polyp had 5 mm and the second 1 had 1 cm in size which were retrieved with snare cautery.  Diverticulosis.  Pathology was consistent with hyperplastic polyps.  Patient advised to repeat in 3 years. the patient denies having any complaints such as melena, hematochezia, abdominal pain or distention, change in her bowel movement consistency or frequency, no changes in her weight recently.  No family history of colorectal cancer. ? ? ?Past Medical History:  ?Diagnosis Date  ? Abdominal pain, chronic, epigastric 08/07/2017  ? Anxiety   ? Arthritis   ? COPD (chronic obstructive pulmonary disease) ([place])   ? GERD (gastroesophageal reflux disease)   ? H/O hiatal hernia   ? High cholesterol   ? Hypertension   ? Hypothyroidism   ? Seasonal allergies   ? ? ?Past Surgical History:  ?Procedure Laterality Date  ? ANTERIOR CERVICAL DECOMP/DISCECTOMY FUSION N/A 01/05/2014  ? Procedure: Cervical five-six, Cervical six-seven, Cervical seven-thoracic one anterior cervical decompression with fusion interbody prosthesis plating and bonegraft;  Surgeon: Erline Levine, MD;  Location: [place] [facility];  Service: Neurosurgery;  Laterality: N/A;  Cervical five-six, Cervical six-seven, Cervical seven-thoracic one anterior cervical decompression with fusion interbody prosthesis plating  ? BIOPSY  01/12/2019  ? Procedure: BIOPSY;  Surgeon: Rogene Houston, MD;  [facility]: [facility];  Service: Endoscopy;;  gastric ulcer  ? BIOPSY  07/21/2020  ? Procedure: BIOPSY;  Surgeon: Rogene Houston, MD;  [facility]: [facility];  Service: Endoscopy;;  esophagus  ? CHOLECYSTECTOMY  N/A 08/23/2017  ? Procedure: LAPAROSCOPIC CHOLECYSTECTOMY;  Surgeon: Aviva Signs, MD;  Location: [facility];  Service: General;  Laterality: N/A;  ? ESOPHAGEAL DILATION N/A 01/12/2019  ? Procedure: ESOPHAGEAL DILATION;  Surgeon: Rogene Houston, MD;  [facility];  Service: Endoscopy;  Laterality: N/A;  ? ESOPHAGEAL DILATION N/A 07/21/2020  ? Procedure: ESOPHAGEAL DILATION;  Surgeon: Rogene Houston, MD;  [facility];  Service: Endoscopy;  Laterality: N/A;  ? ESOPHAGOGASTRODUODENOSCOPY N/A 01/12/2019  ? Procedure: ESOPHAGOGASTRODUODENOSCOPY (EGD);  Surgeon: Rogene Houston, MD;  [facility];  Service: Endoscopy;  Laterality: N/A;  12:00  ? ESOPHAGOGASTRODUODENOSCOPY N/A 07/21/2020  ? Procedure: ESOPHAGOGASTRODUODENOSCOPY (EGD);  Surgeon: Rogene Houston, MD;  [facility];  Service: Endoscopy;  Laterality: N/A;  210  ? EYE SURGERY Bilateral   ? lazy eye surgery  ? FRACTURE SURGERY Right   ? foot  ? FRACTURE SURGERY Right   ? collar bone  ? HERNIA REPAIR    ? umbilical hernia repair  ? KNEE SURGERY    ? SHOULDER ARTHROSCOPY Right   ? TONSILLECTOMY    ? ? ?History reviewed. No pertinent family history. ?Social History:  reports that he quit smoking about 8 years ago. His smoking use included cigarettes. He has a 40.00 pack-year smoking history. He has never used smokeless tobacco. He reports current alcohol use. He reports that he does not use drugs. ? ?Allergies:  ?Allergies  ?Allergen Reactions  ? Atorvastatin   ?  Elevated heart rate  ? Biaxin [Clarithromycin] Other (See Comments)  ?  hiccups   ?  Meloxicam Other (See Comments)  ?  ulcers  ? ? ?Medications Prior to Admission  ?Medication Sig Dispense Refill  ? calcium carbonate (TUMS - DOSED IN MG ELEMENTAL CALCIUM) 500 MG chewable tablet Chew 1 tablet by mouth daily as needed for indigestion or heartburn.    ? Cholecalciferol (VITAMIN D) 50 MCG (2000 UT) tablet Take 2,000 Units by mouth daily.    ? ferrous sulfate 325 (65  FE) MG tablet Take 325 mg by mouth daily with breakfast.    ? ibuprofen (ADVIL) 200 MG tablet Take 200 mg by mouth every 6 (six) hours as needed for moderate pain.    ? levothyroxine (SYNTHROID) 137 MCG tablet Take 137 mcg by mouth daily before breakfast.    ? lisinopril (ZESTRIL) 30 MG tablet Take 30 mg by mouth daily.    ? montelukast (SINGULAIR) 10 MG tablet Take 10 mg by mouth daily.    ? omeprazole (PRILOSEC) 20 MG capsule TAKE 1 CAPSULE BY MOUTH BEFORE BREAKFAST 60 capsule 0  ? polyethylene glycol-electrolytes (TRILYTE) 420 g solution Take 4,000 mLs by mouth as directed. 4000 mL 0  ? simvastatin (ZOCOR) 40 MG tablet Take 40 mg by mouth at bedtime.    ? vitamin B-12 (CYANOCOBALAMIN) 500 MCG tablet Take 500 mcg by mouth daily.    ? ? ?No results found for this or any previous visit (from the past 48 hour(s)). ?No results found. ? ?Review of Systems  ?Constitutional: Negative.   ?HENT: Negative.    ?Eyes: Negative.   ?Respiratory: Negative.    ?Cardiovascular: Negative.   ?Gastrointestinal: Negative.   ?Endocrine: Negative.   ?Genitourinary: Negative.   ?Musculoskeletal: Negative.   ?Skin: Negative.   ?Allergic/Immunologic: Negative.   ?Neurological: Negative.   ?Hematological: Negative.   ?Psychiatric/Behavioral: Negative.    ? ?Blood pressure (!) 159/82, pulse 80, temperature 98 ?F (36.7 ?C), temperature source Oral, resp. rate 17, height '5\' 10"'$  (1.778 m), weight 88.5 kg, SpO2 99 %. ?Physical Exam  ?GENERAL: The patient is AO x3, in no acute distress. ?HEENT: Head is normocephalic and atraumatic. EOMI are intact. Mouth is well hydrated and without lesions. ?NECK: Supple. No masses ?LUNGS: Clear to auscultation. No presence of rhonchi/wheezing/rales. Adequate chest expansion ?HEART: RRR, normal s1 and s2. ?ABDOMEN: Soft, nontender, no guarding, no peritoneal signs, and nondistended. BS +. No masses. ?EXTREMITIES: Without any cyanosis, clubbing, rash, lesions or edema. ?NEUROLOGIC: AOx3, no focal motor  deficit. ?SKIN: no jaundice, no rashes ? ?Assessment/Plan ?Francisco Butler is a 76 y.o. 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  We will proceed with colonoscopy. ? ?Harvel Quale, MD ?03/14/2022, 8:03 AM ? ? ? ?

## 2022-03-14 NOTE — Anesthesia Preprocedure Evaluation (Addendum)
Anesthesia Evaluation  ?Patient identified by MRN, date of birth, ID band ?Patient awake ? ? ? ?Reviewed: ?Allergy & Precautions, NPO status , Patient's Chart, lab work & pertinent test results ? ?Airway ?Mallampati: II ? ?TM Distance: >3 FB ?Neck ROM: Full ? ? ?Comment: ACDF Dental ? ?(+) Dental Advisory Given, Upper Dentures, Edentulous Lower ?  ?Pulmonary ?COPD, former smoker,  ?  ?Pulmonary exam normal ?breath sounds clear to auscultation ? ? ? ? ? ? Cardiovascular ?Exercise Tolerance: Good ?hypertension, Pt. on medications ?Normal cardiovascular exam ?Rhythm:Regular Rate:Normal ? ? ?  ?Neuro/Psych ?PSYCHIATRIC DISORDERS Anxiety  Neuromuscular disease   ? GI/Hepatic ?Neg liver ROS, hiatal hernia, GERD  Medicated and Controlled,  ?Endo/Other  ?Hypothyroidism  ? Renal/GU ?negative Renal ROS  ?negative genitourinary ?  ?Musculoskeletal ? ?(+) Arthritis , Osteoarthritis,   ? Abdominal ?  ?Peds ?negative pediatric ROS ?(+)  Hematology ?negative hematology ROS ?(+)   ?Anesthesia Other Findings ? ? Reproductive/Obstetrics ?negative OB ROS ? ?  ? ? ? ? ? ? ? ? ? ? ? ? ? ?  ?  ? ? ? ? ? ? ? ?Anesthesia Physical ?Anesthesia Plan ? ?ASA: 2 ? ?Anesthesia Plan: General  ? ?Post-op Pain Management: Minimal or no pain anticipated  ? ?Induction: Intravenous ? ?PONV Risk Score and Plan: Propofol infusion ? ?Airway Management Planned: Nasal Cannula and Natural Airway ? ?Additional Equipment:  ? ?Intra-op Plan:  ? ?Post-operative Plan:  ? ?Informed Consent: I have reviewed the patients History and Physical, chart, labs and discussed the procedure including the risks, benefits and alternatives for the proposed anesthesia with the patient or authorized representative who has indicated his/her understanding and acceptance.  ? ? ? ?Dental advisory given ? ?Plan Discussed with: CRNA and Surgeon ? ?Anesthesia Plan Comments:   ? ? ? ? ? ?Anesthesia Quick Evaluation ? ?

## 2022-03-15 LAB — SURGICAL PATHOLOGY

## 2022-03-16 ENCOUNTER — Encounter (HOSPITAL_COMMUNITY): Payer: Self-pay | Admitting: Gastroenterology

## 2022-10-14 ENCOUNTER — Encounter (INDEPENDENT_AMBULATORY_CARE_PROVIDER_SITE_OTHER): Payer: Self-pay | Admitting: Gastroenterology

## 2024-12-07 NOTE — Progress Notes (Signed)
 "     Cardiology Office Note Date:  12/10/2024  ID:  BATES Francisco Butler, DOB 10-Apr-1946, MRN [id number] PCP:  Loralyn Northcutt, FNP  Cardiologist:  Joelle VEAR Ren Donley, MD  Chief Complaint  Patient presents with   Loss of Consciousness     Problems Syncope CKDIII Former tobacco use 40-pack year (Quit 11/2013) Francisco Butler, Francisco Butler  Visits  1/26: TTE, AAA screening, lung Ca screening, EM 14 days   Discussed the use of AI scribe software for clinical note transcription with the patient, who gave verbal consent to proceed.  History of Present Illness   Francisco Butler is a 79 year old male who presents with episodes of syncope and dizziness. He had a syncopal episode at church. While seated he became dizzy on standing, then incoherent and pale with a weak thready pulse. EMS recorded heart rate in the 40s and blood pressure 100/50 mmHg. That day, he mistakenly took his PM medications, including Aricept, in the morning. An EKG was done and he was taken to the ER where troponin levels and another EKG were obtained that was unremarkable. About a year and a half ago, he had a similar episode after flu and COVID vaccines, with mumbling and decreased alertness, but did not go to the hospital. He has occasional chest pain once or twice a month with significant physical activity and no associated shortness of breath. He walks his dog 5 to 6 miles without limitation. He smoked for about 40 to 50 years and quit in 14-Jan-2013. Both brothers died from lung cancer.       ROS: Please see the history of present illness. All other systems are reviewed and negative.   Past Medical History:  Diagnosis Date   Abdominal pain, chronic, epigastric 08/07/2017   Anxiety    Arthritis    COPD (chronic obstructive pulmonary disease) ([facility])    GERD (gastroesophageal reflux disease)    H/O hiatal hernia    High cholesterol    Hypertension    Hypothyroidism    Seasonal allergies     Past Surgical History:  Procedure Laterality  Date   ANTERIOR CERVICAL DECOMP/DISCECTOMY FUSION N/A 01/05/2014   Procedure: Cervical five-six, Cervical six-seven, Cervical seven-thoracic one anterior cervical decompression with fusion interbody prosthesis plating and bonegraft;  Surgeon: Fairy Levels, MD;  Location: [facility];  Service: Neurosurgery;  Laterality: N/A;  Cervical five-six, Cervical six-seven, Cervical seven-thoracic one anterior cervical decompression with fusion interbody prosthesis plating   BIOPSY  01/12/2019   Procedure: BIOPSY;  Surgeon: Golda Claudis PENNER, MD;  [facility];  Service: Endoscopy;;  gastric ulcer   BIOPSY  07/21/2020   Procedure: BIOPSY;  Surgeon: Golda Claudis PENNER, MD;  [facility];  Service: Endoscopy;;  esophagus   CHOLECYSTECTOMY N/A 08/23/2017   Procedure: LAPAROSCOPIC CHOLECYSTECTOMY;  Surgeon: Mavis Anes, MD;  Location: [facility];  Service: General;  Laterality: N/A;   COLONOSCOPY WITH PROPOFOL  N/A 03/14/2022   Procedure: COLONOSCOPY WITH PROPOFOL ;  Surgeon: Eartha Angelia Sieving, MD;  [facility];  Service: Gastroenterology;  Laterality: N/A;  730   ESOPHAGEAL DILATION N/A 01/12/2019   Procedure: ESOPHAGEAL DILATION;  Surgeon: Golda Claudis PENNER, MD;  [facility];  Service: Endoscopy;  Laterality: N/A;   ESOPHAGEAL DILATION N/A 07/21/2020   Procedure: ESOPHAGEAL DILATION;  Surgeon: Golda Claudis PENNER, MD;  [facility];  Service: Endoscopy;  Laterality: N/A;   ESOPHAGOGASTRODUODENOSCOPY N/A 01/12/2019   Procedure: ESOPHAGOGASTRODUODENOSCOPY (EGD);  Surgeon:  Golda Claudis PENNER, MD;  [facility];  Service: Endoscopy;  Laterality: N/A;  12:00   ESOPHAGOGASTRODUODENOSCOPY N/A 07/21/2020   Procedure: ESOPHAGOGASTRODUODENOSCOPY (EGD);  Surgeon: Golda Claudis PENNER, MD;  [facility];  Service: Endoscopy;  Laterality: N/A;  210   EYE SURGERY Bilateral    lazy eye surgery   FRACTURE SURGERY Right    foot   FRACTURE SURGERY Right     collar bone   HERNIA REPAIR     umbilical hernia repair   KNEE SURGERY     POLYPECTOMY  03/14/2022   Procedure: POLYPECTOMY;  Surgeon: Eartha Flavors, Toribio, MD;  Location: [facility];  Service: Gastroenterology;;   SHOULDER ARTHROSCOPY Right    TONSILLECTOMY      Current Outpatient Medications  Medication Sig Dispense Refill   amLODipine (NORVASC) 5 MG tablet Take 5 mg by mouth daily.     calcium carbonate (TUMS - DOSED IN MG ELEMENTAL CALCIUM) 500 MG chewable tablet Chew 1 tablet by mouth daily as needed for indigestion or heartburn.     Cholecalciferol (VITAMIN D) 50 MCG (2000 UT) tablet Take 2,000 Units by mouth daily.     donepezil (ARICEPT) 10 MG tablet Take 10 mg by mouth at bedtime.     escitalopram (LEXAPRO) 10 MG tablet Take 10 mg by mouth daily.     ferrous sulfate 325 (65 FE) MG tablet Take 325 mg by mouth daily with breakfast.     gabapentin  (NEURONTIN ) 300 MG capsule Take 300 mg by mouth daily.     ibuprofen (ADVIL) 200 MG tablet Take 200 mg by mouth every 6 (six) hours as needed for moderate pain.     levothyroxine  (SYNTHROID ) 137 MCG tablet Take 137 mcg by mouth daily before breakfast.     montelukast  (SINGULAIR ) 10 MG tablet Take 10 mg by mouth daily.     Omega-3 Fatty Acids ([employer] FISH OIL PO) Take by mouth.     omeprazole  (PRILOSEC) 20 MG capsule TAKE 1 CAPSULE BY MOUTH BEFORE BREAKFAST 60 capsule 0   prazosin (MINIPRESS) 1 MG capsule Take 1 mg by mouth at bedtime.     rOPINIRole  (REQUIP ) 0.25 MG tablet Take 0.25 mg by mouth at bedtime.     simvastatin  (ZOCOR ) 40 MG tablet Take 40 mg by mouth at bedtime.     Turmeric (QC TUMERIC COMPLEX PO) Take by mouth.     vitamin B-12 (CYANOCOBALAMIN) 500 MCG tablet Take 500 mcg by mouth daily.     lisinopril  (ZESTRIL ) 30 MG tablet Take 30 mg by mouth daily. (Patient not taking: Reported on 12/10/2024)     No current facility-administered medications for this visit.    Allergies:   Atorvastatin, Biaxin [clarithromycin], and  Meloxicam   Social History:  see above  Family History:  see above  PHYSICAL EXAM: VS:  BP 130/68   Pulse 63   Ht 5' 10 (1.778 m)   Wt 188 lb (85.3 kg)   SpO2 93%   BMI 26.98 kg/m  , BMI Body mass index is 26.98 kg/m. GEN: Well nourished, well developed, in no acute distress HEENT: normal Neck: no JVD, carotid bruits, or masses Cardiac: RRR; no murmurs, rubs, or gallops,no edema  Respiratory:  CTAB bilaterally, normal work of breathing GI: soft, nontender, nondistended, + BS Extremities: No LE edema Skin: warm and dry, no rash Neuro:  Strength and sensation are intact  EKG: NSR   Recent Labs: Reviewed  Studies: Reviewed  ASSESSMENT AND PLAN: Charlie  KWEKU STANKEY is a 79 y.o. male who presents for new visit.     Syncope Episode with dizziness, sweating, near loss of consciousness. Pulse 40s, BP 100/50s. Differential: vasovagal syncope (most likely), cardiac arrhythmia. Normal EKG, negative troponins. Possible medication timing issue. - Ordered heart monitor for 14 days to assess for arrhythmias. - Ordered echocardiogram to evaluate heart function. - Advised monitoring blood pressure at home 2-3 times a week, especially in the morning.  Coronary artery disease risk and prevention High risk due to smoking history and age. Lifestyle modifications emphasized. - Emphasized lifestyle modifications to reduce cardiovascular risk. - Advised regular blood pressure monitoring at home.  Lung cancer screening for former tobacco use Former smoker, 50-year history, quit in 2014. Family history of lung cancer. - Ordered CT scan of the chest for lung cancer screening. - Ordered ultrasound of the aorta to screen for aneurysm.      Signed, Joelle VEAR Ren Donley, MD  12/10/2024 3:26 PM    [facility] "

## 2024-12-10 ENCOUNTER — Ambulatory Visit

## 2024-12-10 VITALS — BP 130/68 | HR 63 | Ht 70.0 in | Wt 188.0 lb

## 2024-12-10 DIAGNOSIS — R55 Syncope and collapse: Secondary | ICD-10-CM | POA: Diagnosis not present

## 2024-12-10 DIAGNOSIS — N1831 Chronic kidney disease, stage 3a: Secondary | ICD-10-CM | POA: Diagnosis not present

## 2024-12-10 DIAGNOSIS — Z87891 Personal history of nicotine dependence: Secondary | ICD-10-CM | POA: Diagnosis not present

## 2024-12-10 NOTE — Progress Notes (Unsigned)
 Enrolled for [employer] to mail a ZIO XT long term holter monitor to the patients address on file.   Dr. Ren to read.

## 2024-12-10 NOTE — Patient Instructions (Addendum)
 Medication Instructions:  Your physician recommends that you continue on your current medications as directed. Please refer to the Current Medication list given to you today.  *If you need a refill on your cardiac medications before your next appointment, please call your pharmacy*  Lab Work: None ordered If you have labs (blood work) drawn today and your tests are completely normal, you will receive your results only by: MyChart Message (if you have MyChart) OR A paper copy in the mail If you have any lab test that is abnormal or we need to change your treatment, we will call you to review the results.  Testing/Procedures: Echocardiogram  14 Day Zio Heart Monitor  Abdominal Aortic Aneurysm screening  Lung cancer Screening  Follow-Up: At [facility] you and your health needs are our priority.  As part of our continuing mission to provide you with exceptional heart care, our providers are all part of one team.  This team includes your primary Cardiologist (physician) and Advanced Practice Providers or APPs (Physician Assistants and Nurse Practitioners) who all work together to provide you with the care you need, when you need it.  Your next appointment:   1 year(s)  Provider:   Joelle VEAR Ren Donley, MD    We recommend signing up for the patient portal called MyChart.  Sign up information is provided on this After Visit Summary.  MyChart is used to connect with patients for Virtual Visits (Telemedicine).  Patients are able to view lab/test results, encounter notes, upcoming appointments, etc.  Non-urgent messages can be sent to your provider as well.   To learn more about what you can do with MyChart, go to [email or web address].   Other Instructions Your physician has requested that you have an echocardiogram. Echocardiography is a painless test that uses sound waves to create images of your heart. It provides your doctor with information about the size and shape of  your heart and how well your hearts chambers and valves are working. This procedure takes approximately one hour. There are no restrictions for this procedure. Please do NOT wear cologne, perfume, aftershave, or lotions (deodorant is allowed). Please arrive 15 minutes prior to your appointment time.  Please note: We ask at that you not bring children with you during ultrasound (echo/ vascular) testing. Due to room size and safety concerns, children are not allowed in the ultrasound rooms during exams. Our front office staff cannot provide observation of children in our lobby area while testing is being conducted. An adult accompanying a patient to their appointment will only be allowed in the ultrasound room at the discretion of the ultrasound technician under special circumstances. We apologize for any inconvenience.   Your physician has requested that you have an abdominal aorta aneurysm screening.   ZIO XT- Long Term Monitor Instructions  Your physician has requested you wear a ZIO patch monitor for 14 days.  This is a single patch monitor. [employer] supplies one patch monitor per enrollment. Additional stickers are not available. Please do not apply patch if you will be having a Nuclear Stress Test,  Echocardiogram, Cardiac CT, MRI, or Chest Xray during the period you would be wearing the  monitor. The patch cannot be worn during these tests. You cannot remove and re-apply the  ZIO XT patch monitor.  Your ZIO patch monitor will be mailed 3 day USPS to your address on file. It may take 3-5 days  to receive your monitor after you have been enrolled.  Once you have  received your monitor, please review the enclosed instructions. Your monitor  has already been registered assigning a specific monitor serial # to you.  Billing and Patient Assistance Program Information  We have supplied [employer] with any of your insurance information on file for billing purposes. [employer] offers a sliding scale  Patient Assistance Program for patients that do not have  insurance, or whose insurance does not completely cover the cost of the ZIO monitor.  You must apply for the Patient Assistance Program to qualify for this discounted rate.  To apply, please call [employer] at [phone], select option 4, select option 2, ask to apply for  Patient Assistance Program. Meredeth will ask your household income, and how many people  are in your household. They will quote your out-of-pocket cost based on that information.  [employer] will also be able to set up a 8-month, interest-free payment plan if needed.  Applying the monitor   Shave hair from upper left chest.  Hold abrader disc by orange tab. Rub abrader in 40 strokes over the upper left chest as  indicated in your monitor instructions.  Clean area with 4 enclosed alcohol pads. Let dry.  Apply patch as indicated in monitor instructions. Patch will be placed under collarbone on left  side of chest with arrow pointing upward.  Rub patch adhesive wings for 2 minutes. Remove white label marked 1. Remove the white  label marked 2. Rub patch adhesive wings for 2 additional minutes.  While looking in a mirror, press and release button in center of patch. A small green light will  flash 3-4 times. This will be your only indicator that the monitor has been turned on.  Do not shower for the first 24 hours. You may shower after the first 24 hours.  Press the button if you feel a symptom. You will hear a small click. Record Date, Time and  Symptom in the Patient Logbook.  When you are ready to remove the patch, follow instructions on the last 2 pages of Patient  Logbook. Stick patch monitor onto the last page of Patient Logbook.  Place Patient Logbook in the blue and white box. Use locking tab on box and tape box closed  securely. The blue and white box has prepaid postage on it. Please place it in the mailbox as  soon as possible. Your physician should have  your test results approximately 7 days after the  monitor has been mailed back to [facility].  Call [facility] Customer Care at [phone] if you have questions regarding  your ZIO XT patch monitor. Call them immediately if you see an orange light blinking on your  monitor.  If your monitor falls off in less than 4 days, contact our Monitor department at [phone].  If your monitor becomes loose or falls off after 4 days call [employer] at [phone] for  suggestions on securing your monitor   Doctor wants you to have a Lung cancer screening. Dr. Ren wants you to have Abdominal Aortic Aneurysm screening.  Your physician has requested that you regularly monitor and record your blood pressure readings at home. Please use the same machine at the same time of day to check your readings and record them to bring to your follow-up visit.   Please monitor blood pressures and keep a log of your readings for two weeks and send in via mychart.    Make sure to check 2 hours after your medications.    AVOID these things for 30 minutes  before checking your blood pressure: No Drinking caffeine. No Drinking alcohol. No Eating. No Smoking. No Exercising.   Five minutes before checking your blood pressure: Pee. Sit in a dining chair. Avoid sitting in a soft couch or armchair. Be quiet. Do not talk

## 2025-01-08 ENCOUNTER — Ambulatory Visit: Payer: Self-pay

## 2025-01-08 DIAGNOSIS — R55 Syncope and collapse: Secondary | ICD-10-CM

## 2025-01-20 ENCOUNTER — Ambulatory Visit (HOSPITAL_COMMUNITY)
